# Patient Record
Sex: Female | Born: 1960 | Race: White | Hispanic: No | Marital: Married | Smoking: Never smoker
Health system: Southern US, Community
[De-identification: ages and names within clinical notes are randomized; demographics above are authoritative.]

## PROBLEM LIST (undated history)

## (undated) DIAGNOSIS — R569 Unspecified convulsions: Secondary | ICD-10-CM

## (undated) DIAGNOSIS — C801 Malignant (primary) neoplasm, unspecified: Secondary | ICD-10-CM

## (undated) DIAGNOSIS — C439 Malignant melanoma of skin, unspecified: Secondary | ICD-10-CM

## (undated) DIAGNOSIS — I639 Cerebral infarction, unspecified: Secondary | ICD-10-CM

## (undated) DIAGNOSIS — C8590 Non-Hodgkin lymphoma, unspecified, unspecified site: Secondary | ICD-10-CM

## (undated) HISTORY — PX: OCCIPITAL NERVE STIMULATOR INSERTION: SHX2095

## (undated) HISTORY — PX: BRAIN SURGERY: SHX531

## (undated) HISTORY — PX: ABDOMINAL HYSTERECTOMY: SHX81

---

## 1997-10-26 ENCOUNTER — Inpatient Hospital Stay (HOSPITAL_COMMUNITY): Admission: AD | Admit: 1997-10-26 | Discharge: 1997-10-26 | Payer: Self-pay | Admitting: Obstetrics and Gynecology

## 1997-12-12 ENCOUNTER — Inpatient Hospital Stay (HOSPITAL_COMMUNITY): Admission: AD | Admit: 1997-12-12 | Discharge: 1997-12-12 | Payer: Self-pay | Admitting: Obstetrics and Gynecology

## 1997-12-21 ENCOUNTER — Inpatient Hospital Stay (HOSPITAL_COMMUNITY): Admission: AD | Admit: 1997-12-21 | Discharge: 1997-12-23 | Payer: Self-pay | Admitting: Obstetrics and Gynecology

## 1998-01-16 ENCOUNTER — Inpatient Hospital Stay (HOSPITAL_COMMUNITY): Admission: AD | Admit: 1998-01-16 | Discharge: 1998-01-18 | Payer: Self-pay | Admitting: Obstetrics & Gynecology

## 1998-02-22 ENCOUNTER — Other Ambulatory Visit: Admission: RE | Admit: 1998-02-22 | Discharge: 1998-02-22 | Payer: Self-pay | Admitting: *Deleted

## 1998-04-05 ENCOUNTER — Ambulatory Visit (HOSPITAL_COMMUNITY): Admission: RE | Admit: 1998-04-05 | Discharge: 1998-04-05 | Payer: Self-pay | Admitting: *Deleted

## 1999-06-17 ENCOUNTER — Other Ambulatory Visit: Admission: RE | Admit: 1999-06-17 | Discharge: 1999-06-17 | Payer: Self-pay | Admitting: *Deleted

## 2000-06-09 ENCOUNTER — Other Ambulatory Visit: Admission: RE | Admit: 2000-06-09 | Discharge: 2000-06-09 | Payer: Self-pay | Admitting: *Deleted

## 2001-06-10 ENCOUNTER — Other Ambulatory Visit: Admission: RE | Admit: 2001-06-10 | Discharge: 2001-06-10 | Payer: Self-pay | Admitting: *Deleted

## 2001-10-15 ENCOUNTER — Ambulatory Visit (HOSPITAL_COMMUNITY): Admission: RE | Admit: 2001-10-15 | Discharge: 2001-10-15 | Payer: Self-pay | Admitting: *Deleted

## 2001-10-15 ENCOUNTER — Encounter (INDEPENDENT_AMBULATORY_CARE_PROVIDER_SITE_OTHER): Payer: Self-pay

## 2001-11-11 ENCOUNTER — Inpatient Hospital Stay (HOSPITAL_COMMUNITY): Admission: AD | Admit: 2001-11-11 | Discharge: 2001-11-11 | Payer: Self-pay | Admitting: Obstetrics & Gynecology

## 2002-07-12 ENCOUNTER — Encounter (INDEPENDENT_AMBULATORY_CARE_PROVIDER_SITE_OTHER): Payer: Self-pay

## 2002-07-13 ENCOUNTER — Inpatient Hospital Stay (HOSPITAL_COMMUNITY): Admission: RE | Admit: 2002-07-13 | Discharge: 2002-07-14 | Payer: Self-pay | Admitting: *Deleted

## 2002-09-05 ENCOUNTER — Emergency Department (HOSPITAL_COMMUNITY): Admission: EM | Admit: 2002-09-05 | Discharge: 2002-09-06 | Payer: Self-pay | Admitting: Emergency Medicine

## 2002-09-06 ENCOUNTER — Encounter: Payer: Self-pay | Admitting: Emergency Medicine

## 2003-10-11 ENCOUNTER — Other Ambulatory Visit: Admission: RE | Admit: 2003-10-11 | Discharge: 2003-10-11 | Payer: Self-pay | Admitting: *Deleted

## 2005-07-16 ENCOUNTER — Inpatient Hospital Stay (HOSPITAL_COMMUNITY): Admission: EM | Admit: 2005-07-16 | Discharge: 2005-07-17 | Payer: Self-pay | Admitting: Emergency Medicine

## 2005-07-16 ENCOUNTER — Encounter (INDEPENDENT_AMBULATORY_CARE_PROVIDER_SITE_OTHER): Payer: Self-pay | Admitting: Specialist

## 2005-08-06 ENCOUNTER — Other Ambulatory Visit: Admission: RE | Admit: 2005-08-06 | Discharge: 2005-08-06 | Payer: Self-pay | Admitting: Obstetrics and Gynecology

## 2006-06-09 ENCOUNTER — Encounter: Admission: RE | Admit: 2006-06-09 | Discharge: 2006-06-09 | Payer: Self-pay | Admitting: Family Medicine

## 2007-04-28 ENCOUNTER — Inpatient Hospital Stay (HOSPITAL_COMMUNITY): Admission: EM | Admit: 2007-04-28 | Discharge: 2007-04-30 | Payer: Self-pay | Admitting: Emergency Medicine

## 2007-06-02 ENCOUNTER — Emergency Department (HOSPITAL_COMMUNITY): Admission: EM | Admit: 2007-06-02 | Discharge: 2007-06-03 | Payer: Self-pay | Admitting: Emergency Medicine

## 2007-11-13 ENCOUNTER — Emergency Department (HOSPITAL_COMMUNITY): Admission: EM | Admit: 2007-11-13 | Discharge: 2007-11-13 | Payer: Self-pay | Admitting: Emergency Medicine

## 2007-12-31 ENCOUNTER — Emergency Department (HOSPITAL_COMMUNITY): Admission: EM | Admit: 2007-12-31 | Discharge: 2007-12-31 | Payer: Self-pay | Admitting: Emergency Medicine

## 2008-04-06 ENCOUNTER — Encounter: Payer: Self-pay | Admitting: Emergency Medicine

## 2008-04-06 ENCOUNTER — Inpatient Hospital Stay (HOSPITAL_COMMUNITY): Admission: RE | Admit: 2008-04-06 | Discharge: 2008-04-08 | Payer: Self-pay | Admitting: Internal Medicine

## 2008-04-07 ENCOUNTER — Ambulatory Visit: Payer: Self-pay | Admitting: Cardiology

## 2008-04-08 ENCOUNTER — Encounter: Payer: Self-pay | Admitting: Internal Medicine

## 2008-04-18 IMAGING — CR DG CHEST 2V
2 series · 2 of 2 positions shown · non-contrast
Comparison: 06/02/2007.

CLINICAL DATA: Shortness of breath, chest pain, tachycardia, cough

CHEST - 2 VIEW

[w chest lat]
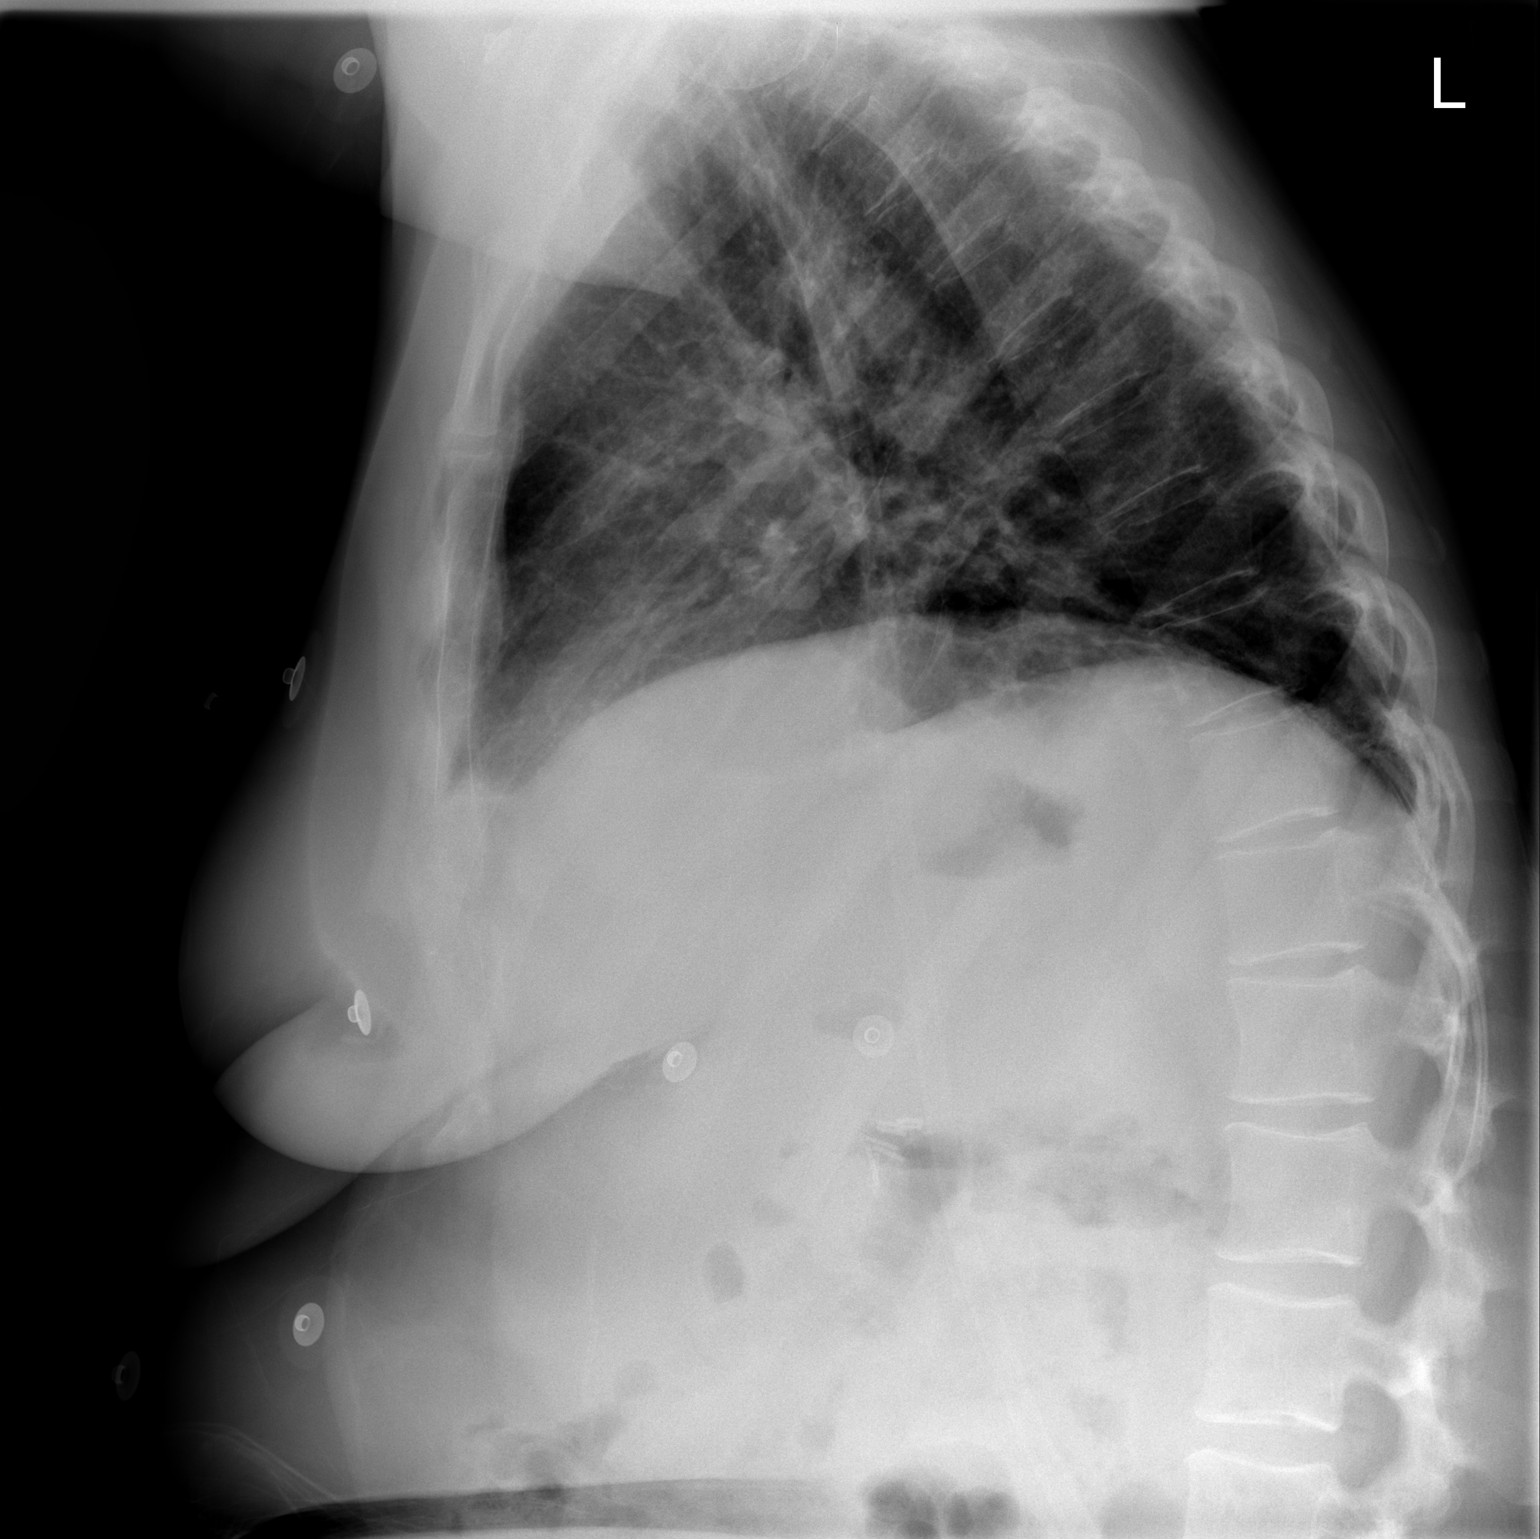

[w chest pa]
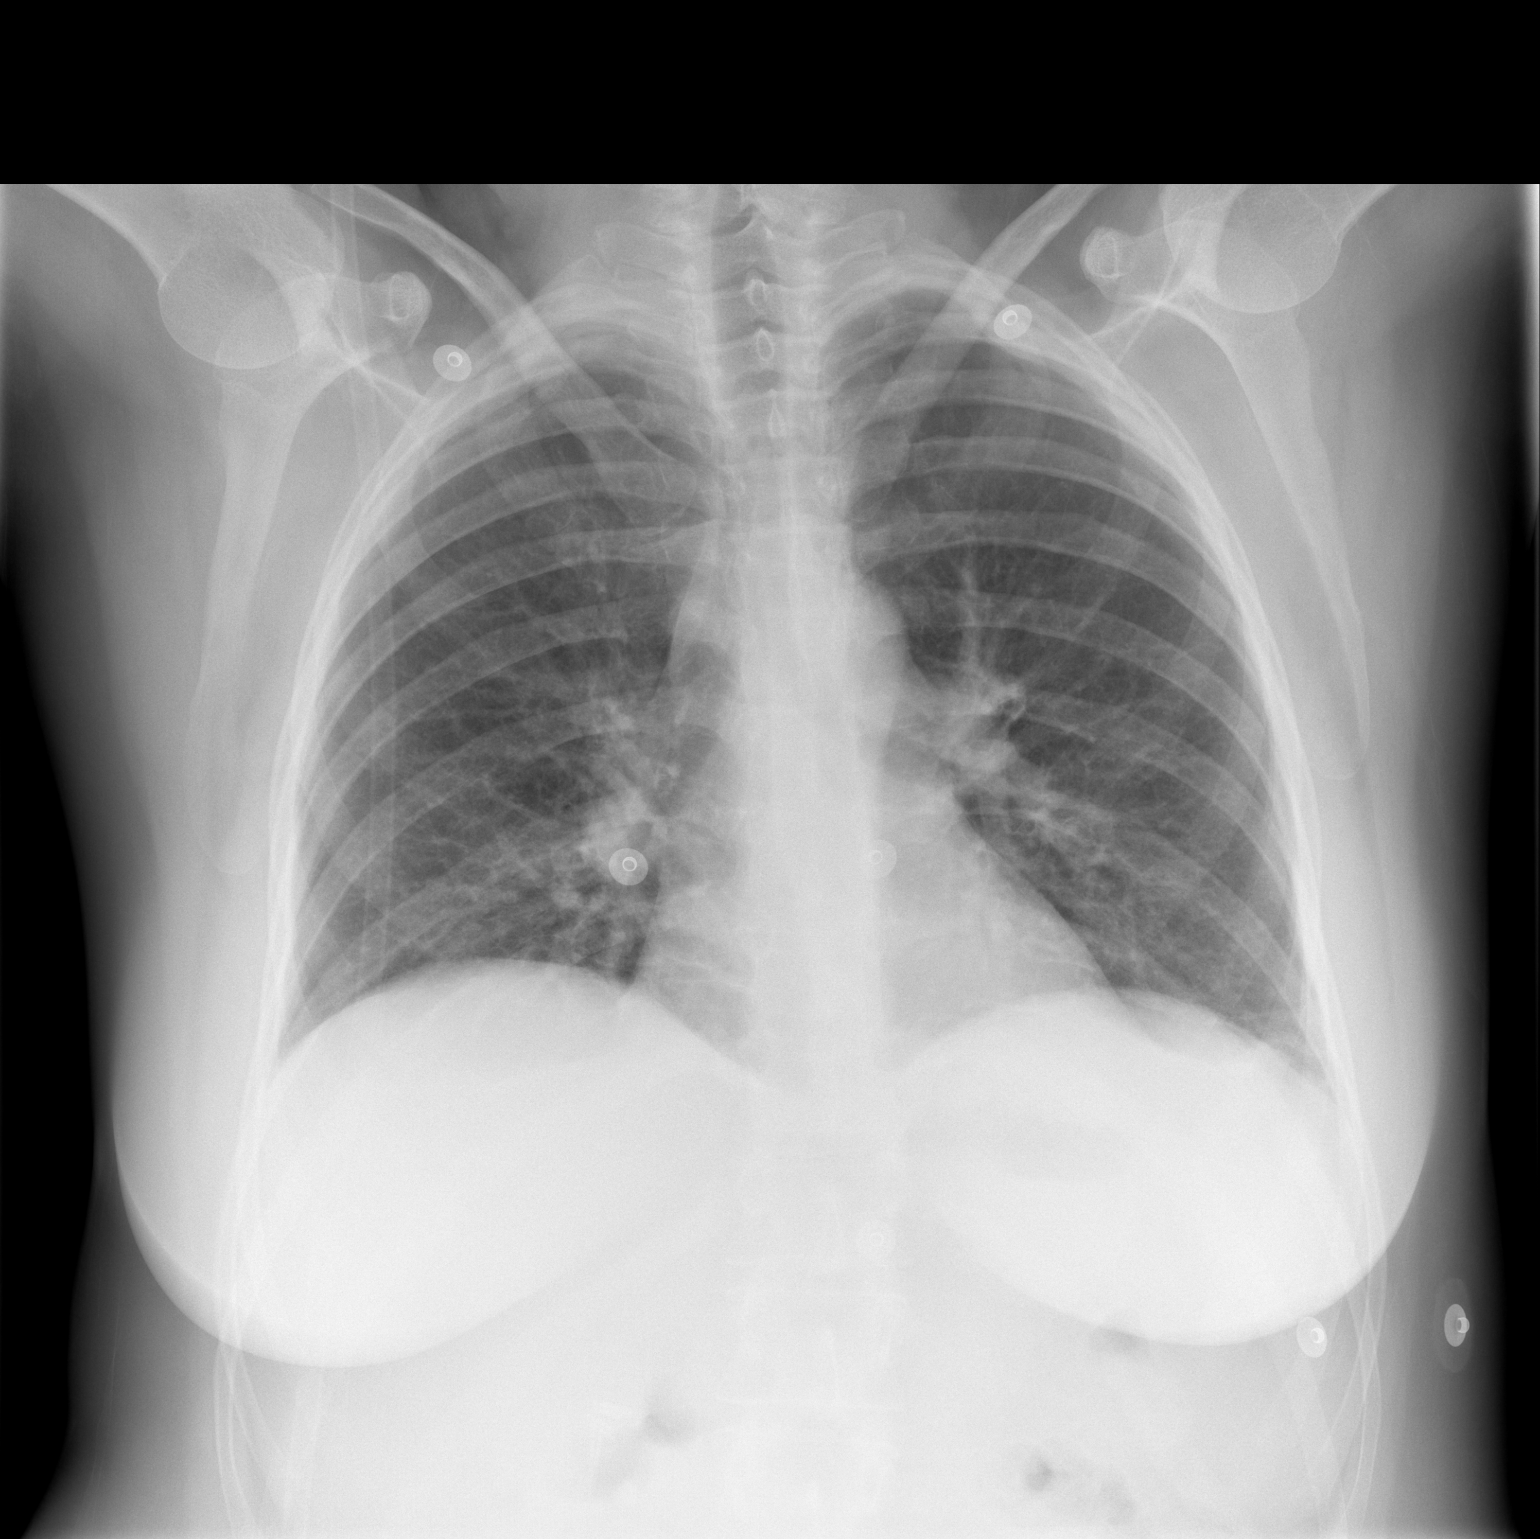

[2 of 2 positions shown; findings below may reference images not displayed]

FINDINGS: There are slightly prominent markings at the lung bases
most consistent with atelectasis.  No focal pneumonia is seen.
Mild peribronchial thickening is noted.  No effusion is seen.
Heart is within normal limits in size.
IMPRESSION: Probable mild bibasilar linear atelectasis.  No infiltrate or
effusion is seen.

## 2008-05-20 ENCOUNTER — Emergency Department (HOSPITAL_BASED_OUTPATIENT_CLINIC_OR_DEPARTMENT_OTHER): Admission: EM | Admit: 2008-05-20 | Discharge: 2008-05-20 | Payer: Self-pay | Admitting: Emergency Medicine

## 2008-08-03 ENCOUNTER — Emergency Department (HOSPITAL_BASED_OUTPATIENT_CLINIC_OR_DEPARTMENT_OTHER): Admission: EM | Admit: 2008-08-03 | Discharge: 2008-08-03 | Payer: Self-pay | Admitting: Emergency Medicine

## 2008-08-27 ENCOUNTER — Emergency Department (HOSPITAL_COMMUNITY): Admission: EM | Admit: 2008-08-27 | Discharge: 2008-08-27 | Payer: Self-pay | Admitting: Emergency Medicine

## 2008-09-07 ENCOUNTER — Ambulatory Visit: Payer: Self-pay | Admitting: Interventional Radiology

## 2008-09-07 ENCOUNTER — Emergency Department (HOSPITAL_BASED_OUTPATIENT_CLINIC_OR_DEPARTMENT_OTHER): Admission: EM | Admit: 2008-09-07 | Discharge: 2008-09-07 | Payer: Self-pay | Admitting: Emergency Medicine

## 2008-09-08 ENCOUNTER — Emergency Department (HOSPITAL_BASED_OUTPATIENT_CLINIC_OR_DEPARTMENT_OTHER): Admission: EM | Admit: 2008-09-08 | Discharge: 2008-09-08 | Payer: Self-pay | Admitting: Emergency Medicine

## 2008-09-26 ENCOUNTER — Ambulatory Visit: Payer: Self-pay | Admitting: Diagnostic Radiology

## 2008-09-26 ENCOUNTER — Emergency Department (HOSPITAL_BASED_OUTPATIENT_CLINIC_OR_DEPARTMENT_OTHER): Admission: EM | Admit: 2008-09-26 | Discharge: 2008-09-26 | Payer: Self-pay | Admitting: Emergency Medicine

## 2008-10-09 ENCOUNTER — Ambulatory Visit: Payer: Self-pay | Admitting: Interventional Radiology

## 2008-10-09 ENCOUNTER — Emergency Department (HOSPITAL_BASED_OUTPATIENT_CLINIC_OR_DEPARTMENT_OTHER): Admission: EM | Admit: 2008-10-09 | Discharge: 2008-10-09 | Payer: Self-pay | Admitting: Internal Medicine

## 2008-11-19 ENCOUNTER — Emergency Department (HOSPITAL_BASED_OUTPATIENT_CLINIC_OR_DEPARTMENT_OTHER): Admission: EM | Admit: 2008-11-19 | Discharge: 2008-11-19 | Payer: Self-pay | Admitting: Internal Medicine

## 2008-12-10 ENCOUNTER — Emergency Department (HOSPITAL_BASED_OUTPATIENT_CLINIC_OR_DEPARTMENT_OTHER): Admission: EM | Admit: 2008-12-10 | Discharge: 2008-12-10 | Payer: Self-pay | Admitting: Emergency Medicine

## 2008-12-18 ENCOUNTER — Emergency Department (HOSPITAL_BASED_OUTPATIENT_CLINIC_OR_DEPARTMENT_OTHER): Admission: EM | Admit: 2008-12-18 | Discharge: 2008-12-18 | Payer: Self-pay | Admitting: Emergency Medicine

## 2008-12-18 ENCOUNTER — Ambulatory Visit: Payer: Self-pay | Admitting: Radiology

## 2009-01-02 ENCOUNTER — Inpatient Hospital Stay (HOSPITAL_COMMUNITY): Admission: EM | Admit: 2009-01-02 | Discharge: 2009-01-04 | Payer: Self-pay | Admitting: Emergency Medicine

## 2009-01-04 ENCOUNTER — Inpatient Hospital Stay (HOSPITAL_COMMUNITY): Admission: RE | Admit: 2009-01-04 | Discharge: 2009-01-06 | Payer: Self-pay | Admitting: *Deleted

## 2009-01-04 ENCOUNTER — Ambulatory Visit: Payer: Self-pay | Admitting: *Deleted

## 2009-02-27 ENCOUNTER — Observation Stay (HOSPITAL_COMMUNITY): Admission: EM | Admit: 2009-02-27 | Discharge: 2009-02-28 | Payer: Self-pay | Admitting: Emergency Medicine

## 2009-02-27 ENCOUNTER — Ambulatory Visit: Payer: Self-pay | Admitting: *Deleted

## 2009-02-27 ENCOUNTER — Ambulatory Visit: Payer: Self-pay | Admitting: Internal Medicine

## 2009-02-28 ENCOUNTER — Encounter: Payer: Self-pay | Admitting: Internal Medicine

## 2009-04-06 IMAGING — CT CT HEAD W/O CM
1 series · 16 of 30 positions shown, 20 images · non-contrast
Comparison: None

CLINICAL DATA: Headache

CT HEAD WITHOUT CONTRAST
TECHNIQUE: Contiguous axial images were obtained from the base of
the skull through the vertex without contrast.

[Series 2: head 4.8 h37s · axial · 0.44mm/px · z∈[+1258,+1395]mm · 16 of 32 slices shown, 20 images]
[im 2/32  brain]
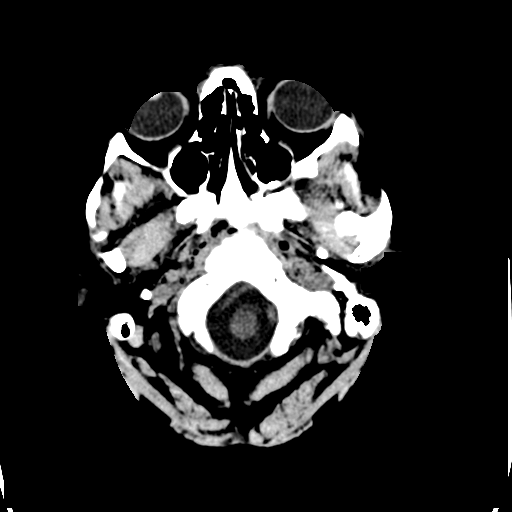
[im 2/32  bone]
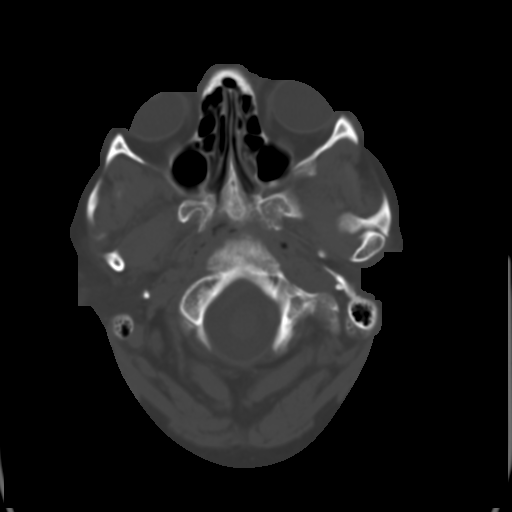
[im 4/32  brain]
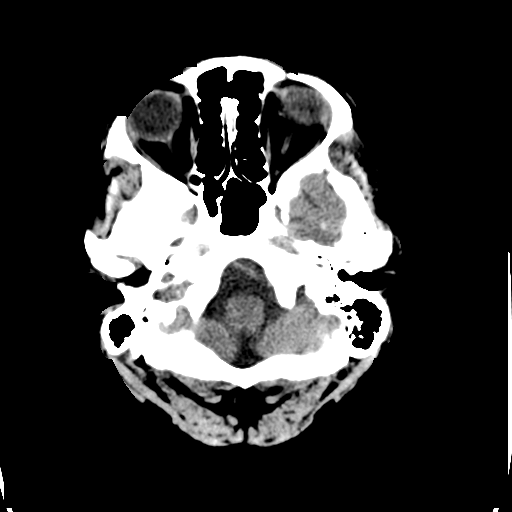
[im 6/32  brain]
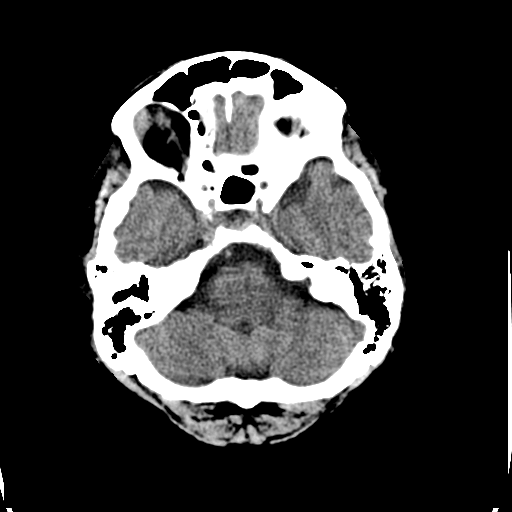
[im 8/32  brain]
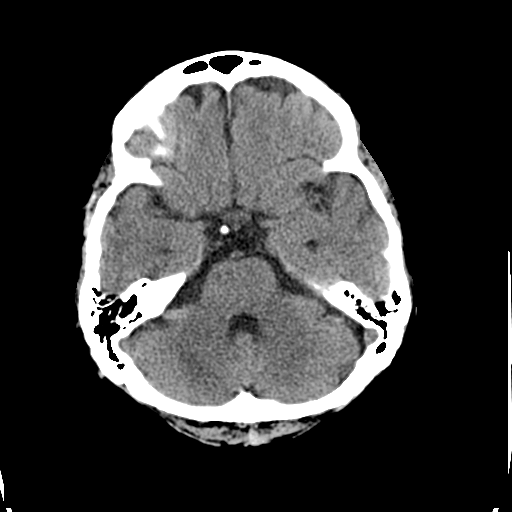
[im 9/32  brain]
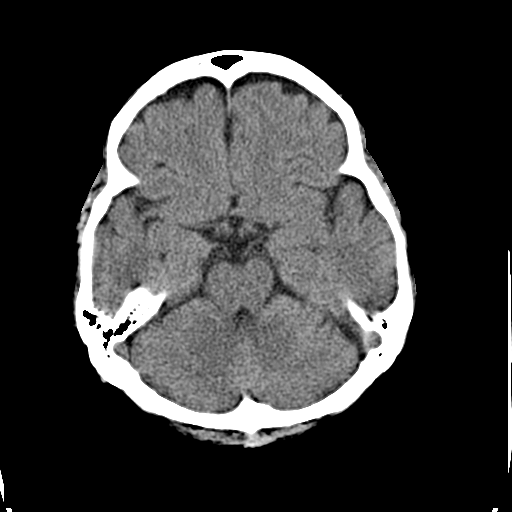
[im 9/32  bone]
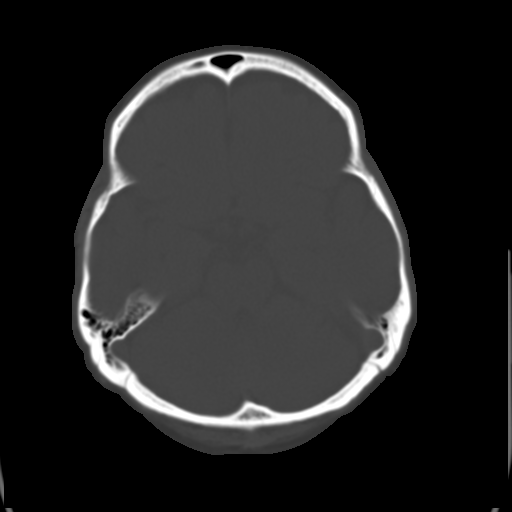
[im 11/32  brain]
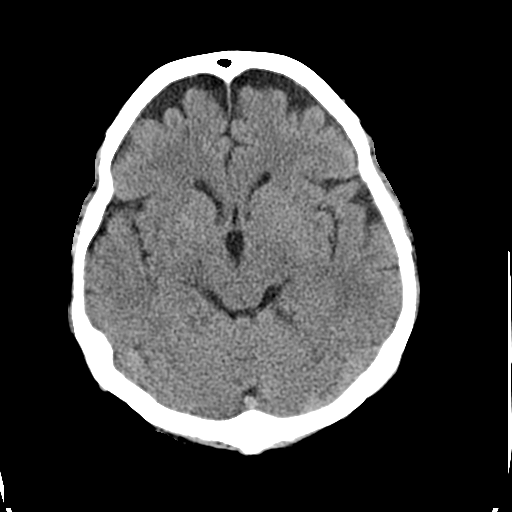
[im 13/32  brain]
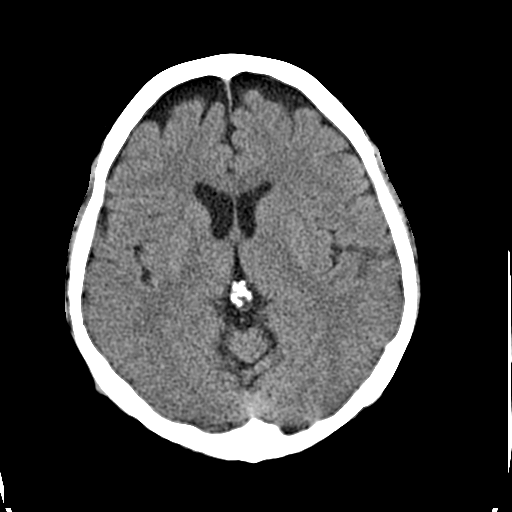
[im 15/32  brain]
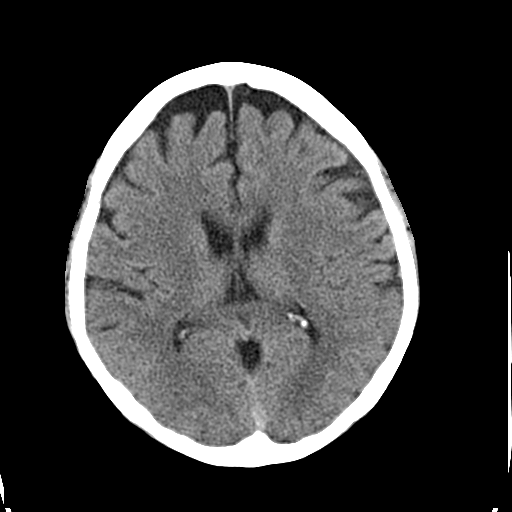
[im 17/32  brain]
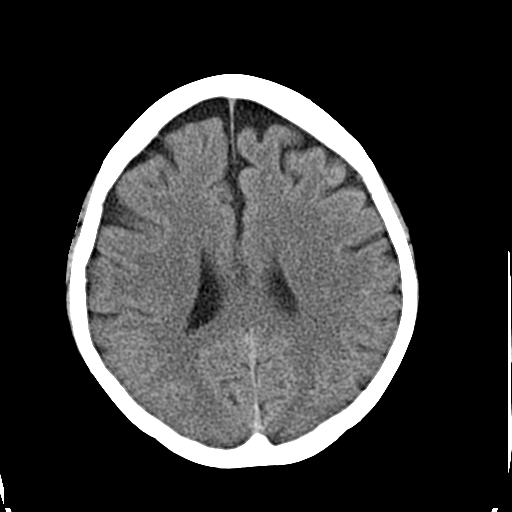
[im 17/32  bone]
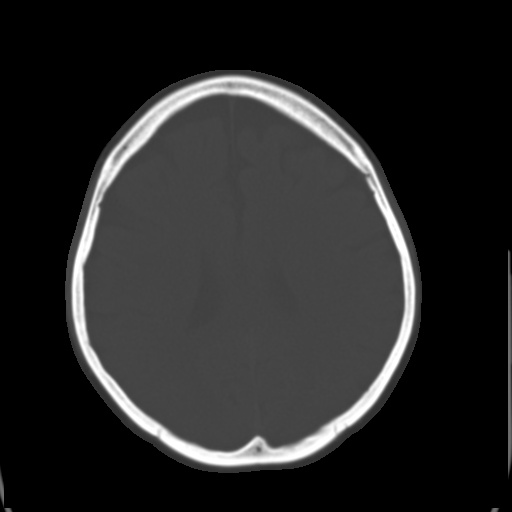
[im 19/32  brain]
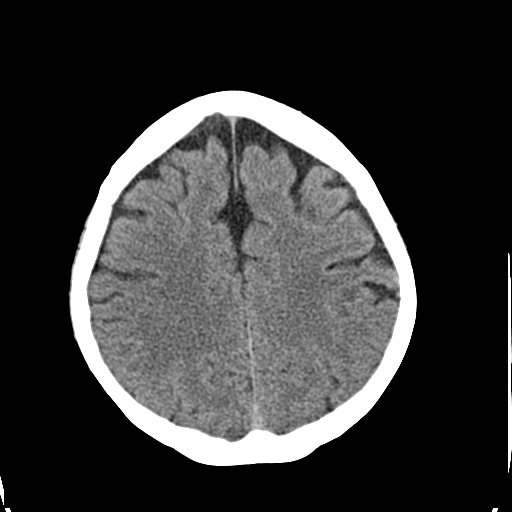
[im 21/32  brain]
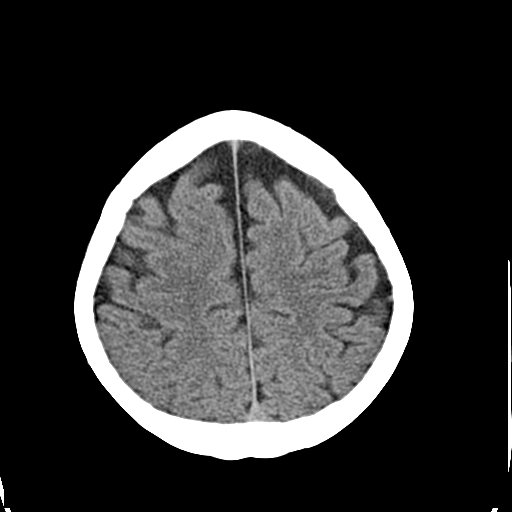
[im 23/32  brain]
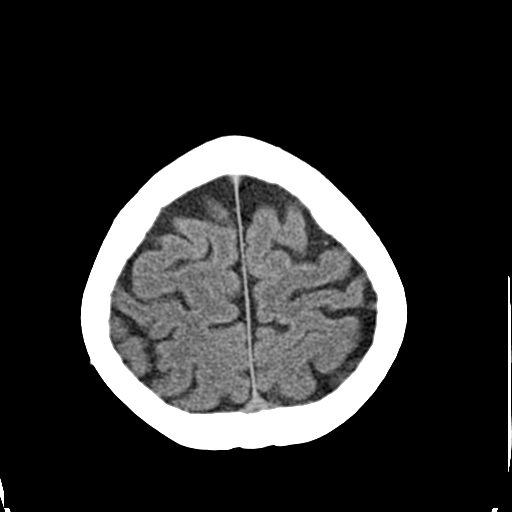
[im 24/32  brain]
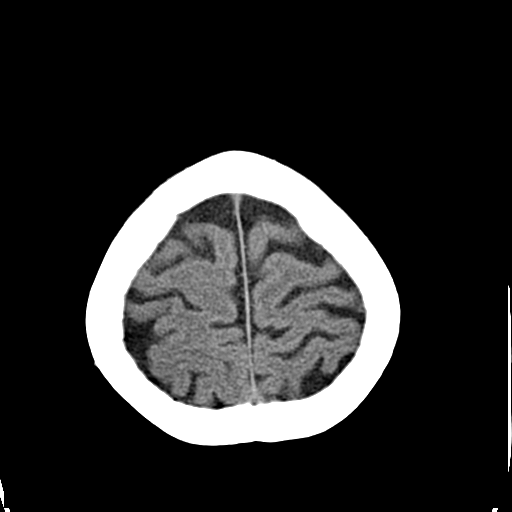
[im 24/32  bone]
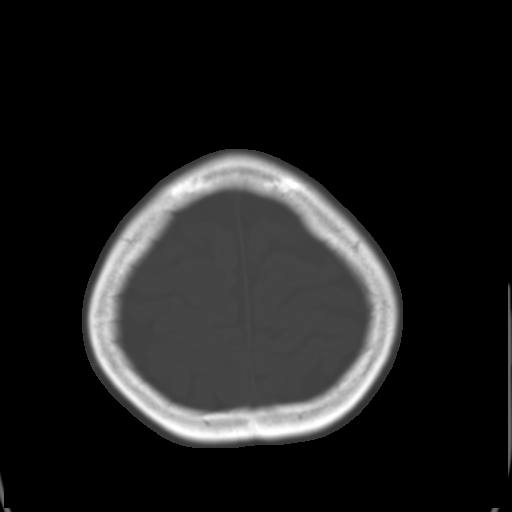
[im 26/32  brain]
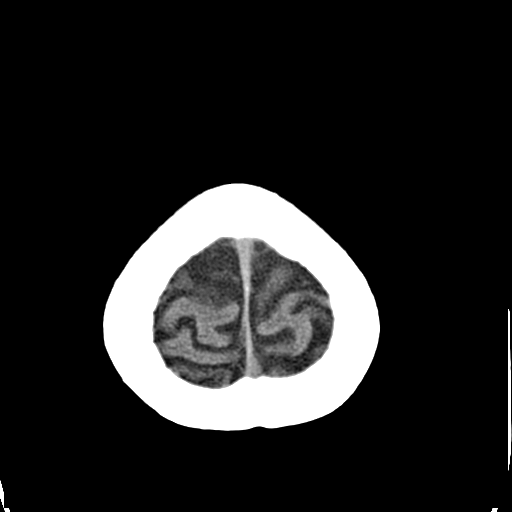
[im 28/32  brain]
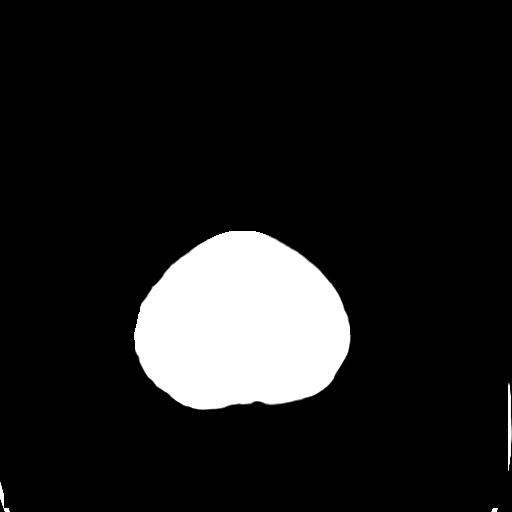
[im 30/32  brain]
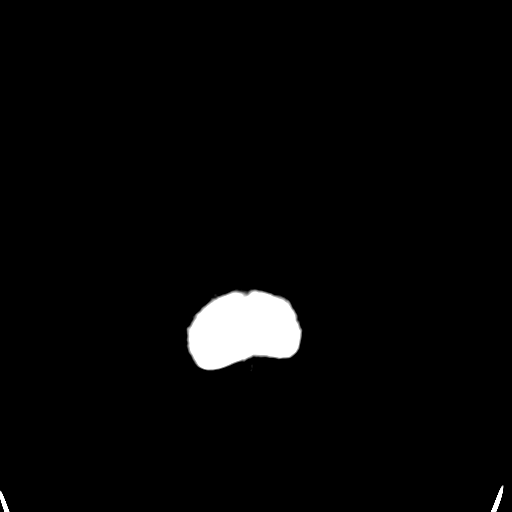

[16 of 30 positions shown; findings below may reference images not displayed]

FINDINGS: No acute intracranial hemorrhage or edema.  Bilateral
frontal lobe atrophy.  Ventricles and basilar cisterns are midline.
No extraaxial fluid collections.
IMPRESSION: Negative for acute intracranial hemorrhage, acute infarction or
mass lesions.  Bilateral frontal atrophy is noted.

## 2009-07-04 ENCOUNTER — Ambulatory Visit: Payer: Self-pay | Admitting: Diagnostic Radiology

## 2009-07-04 ENCOUNTER — Emergency Department (HOSPITAL_BASED_OUTPATIENT_CLINIC_OR_DEPARTMENT_OTHER): Admission: EM | Admit: 2009-07-04 | Discharge: 2009-07-04 | Payer: Self-pay | Admitting: Emergency Medicine

## 2009-08-11 ENCOUNTER — Emergency Department (HOSPITAL_BASED_OUTPATIENT_CLINIC_OR_DEPARTMENT_OTHER): Admission: EM | Admit: 2009-08-11 | Discharge: 2009-08-11 | Payer: Self-pay | Admitting: Emergency Medicine

## 2009-08-11 ENCOUNTER — Ambulatory Visit: Payer: Self-pay | Admitting: Diagnostic Radiology

## 2009-08-12 ENCOUNTER — Emergency Department (HOSPITAL_BASED_OUTPATIENT_CLINIC_OR_DEPARTMENT_OTHER): Admission: EM | Admit: 2009-08-12 | Discharge: 2009-08-12 | Payer: Self-pay | Admitting: Emergency Medicine

## 2009-11-26 ENCOUNTER — Emergency Department (HOSPITAL_BASED_OUTPATIENT_CLINIC_OR_DEPARTMENT_OTHER): Admission: EM | Admit: 2009-11-26 | Discharge: 2009-11-27 | Payer: Self-pay | Admitting: Emergency Medicine

## 2010-06-06 ENCOUNTER — Emergency Department (HOSPITAL_BASED_OUTPATIENT_CLINIC_OR_DEPARTMENT_OTHER)
Admission: EM | Admit: 2010-06-06 | Discharge: 2010-06-06 | Payer: Self-pay | Source: Home / Self Care | Admitting: Emergency Medicine

## 2010-12-03 ENCOUNTER — Emergency Department (HOSPITAL_BASED_OUTPATIENT_CLINIC_OR_DEPARTMENT_OTHER)
Admission: EM | Admit: 2010-12-03 | Discharge: 2010-12-04 | Disposition: A | Discharge status: Home / Self Care | Payer: Federal, State, Local not specified - PPO | Attending: Emergency Medicine | Admitting: Emergency Medicine

## 2010-12-03 DIAGNOSIS — R110 Nausea: Secondary | ICD-10-CM | POA: Insufficient documentation

## 2010-12-03 DIAGNOSIS — H53149 Visual discomfort, unspecified: Secondary | ICD-10-CM | POA: Insufficient documentation

## 2010-12-03 DIAGNOSIS — Z79899 Other long term (current) drug therapy: Secondary | ICD-10-CM | POA: Insufficient documentation

## 2010-12-03 DIAGNOSIS — R51 Headache: Secondary | ICD-10-CM | POA: Insufficient documentation

## 2010-12-03 DIAGNOSIS — I10 Essential (primary) hypertension: Secondary | ICD-10-CM | POA: Insufficient documentation

## 2010-12-26 LAB — DIFFERENTIAL
Basophils Absolute: 0 10*3/uL (ref 0.0–0.1)
Basophils Relative: 1 % (ref 0–1)
Eosinophils Absolute: 0.1 10*3/uL (ref 0.0–0.7)
Monocytes Absolute: 0.3 10*3/uL (ref 0.1–1.0)
Neutro Abs: 2.8 10*3/uL (ref 1.7–7.7)
Neutrophils Relative %: 60 % (ref 43–77)

## 2010-12-26 LAB — BASIC METABOLIC PANEL
CO2: 30 mEq/L (ref 19–32)
Calcium: 9.3 mg/dL (ref 8.4–10.5)
Creatinine, Ser: 0.7 mg/dL (ref 0.4–1.2)
Glucose, Bld: 196 mg/dL — ABNORMAL HIGH (ref 70–99)

## 2010-12-26 LAB — CBC
Hemoglobin: 13.2 g/dL (ref 12.0–15.0)
MCHC: 35.3 g/dL (ref 30.0–36.0)
RDW: 11.9 % (ref 11.5–15.5)

## 2010-12-26 LAB — POCT CARDIAC MARKERS: CKMB, poc: <1 ng/mL — ABNORMAL LOW (ref 1.0–8.0)

## 2010-12-30 LAB — BASIC METABOLIC PANEL
BUN: 11 mg/dL (ref 6–23)
CO2: 27 mEq/L (ref 19–32)
GFR calc non Af Amer: >60 mL/min (ref >60)
Glucose, Bld: 148 mg/dL — ABNORMAL HIGH (ref 70–99)
Potassium: 4.5 mEq/L (ref 3.5–5.1)

## 2010-12-30 LAB — LIPID PANEL
Cholesterol: 177 mg/dL (ref 0–200)
HDL: 32 mg/dL — ABNORMAL LOW (ref >39)
Total CHOL/HDL Ratio: 5.5 RATIO
Triglycerides: 155 mg/dL — ABNORMAL HIGH (ref <150)

## 2010-12-30 LAB — COMPREHENSIVE METABOLIC PANEL
ALT: 80 U/L — ABNORMAL HIGH (ref 0–35)
CO2: 24 mEq/L (ref 19–32)
Calcium: 9.1 mg/dL (ref 8.4–10.5)
Creatinine, Ser: 0.81 mg/dL (ref 0.4–1.2)
GFR calc Af Amer: >60 mL/min (ref >60)
GFR calc non Af Amer: >60 mL/min (ref >60)
Glucose, Bld: 127 mg/dL — ABNORMAL HIGH (ref 70–99)
Sodium: 142 mEq/L (ref 135–145)
Total Protein: 6.3 g/dL (ref 6.0–8.3)

## 2010-12-30 LAB — URINE DRUGS OF ABUSE SCREEN W ALC, ROUTINE (REF LAB)
Barbiturate Quant, Ur: NEGATIVE
Creatinine,U: 141.9 mg/dL
Ethyl Alcohol: <10 mg/dL (ref <10)
Marijuana Metabolite: NEGATIVE
Opiate Screen, Urine: NEGATIVE

## 2010-12-30 LAB — CBC
HCT: 40.7 % (ref 36.0–46.0)
Hemoglobin: 13.4 g/dL (ref 12.0–15.0)
MCHC: 34.4 g/dL (ref 30.0–36.0)
MCHC: 35.7 g/dL (ref 30.0–36.0)
MCV: 87.3 fL (ref 78.0–100.0)
MCV: 87.4 fL (ref 78.0–100.0)
Platelets: 187 10*3/uL (ref 150–400)
RBC: 4.3 MIL/uL (ref 3.87–5.11)
RDW: 13 % (ref 11.5–15.5)
RDW: 13 % (ref 11.5–15.5)

## 2010-12-30 LAB — GLUCOSE, CAPILLARY
Glucose-Capillary: 100 mg/dL — ABNORMAL HIGH (ref 70–99)
Glucose-Capillary: 125 mg/dL — ABNORMAL HIGH (ref 70–99)
Glucose-Capillary: 126 mg/dL — ABNORMAL HIGH (ref 70–99)

## 2010-12-30 LAB — CARDIAC PANEL(CRET KIN+CKTOT+MB+TROPI)
CK, MB: 0.8 ng/mL (ref 0.3–4.0)
Relative Index: INVALID (ref 0.0–2.5)
Total CK: 37 U/L (ref 7–177)
Troponin I: 0.01 ng/mL (ref 0.00–0.06)

## 2010-12-30 LAB — CK TOTAL AND CKMB (NOT AT ARMC)
CK, MB: 0.6 ng/mL (ref 0.3–4.0)
Relative Index: INVALID (ref 0.0–2.5)
Total CK: 45 U/L (ref 7–177)

## 2010-12-30 LAB — TSH: TSH: 1.112 u[IU]/mL (ref 0.350–4.500)

## 2010-12-30 LAB — RAPID URINE DRUG SCREEN, HOSP PERFORMED
Amphetamines: NOT DETECTED
Tetrahydrocannabinol: NOT DETECTED

## 2010-12-30 LAB — TROPONIN I: Troponin I: 0.01 ng/mL (ref 0.00–0.06)

## 2010-12-30 LAB — POCT CARDIAC MARKERS: Myoglobin, poc: 44.9 ng/mL (ref 12–200)

## 2011-01-01 LAB — POCT I-STAT, CHEM 8
Calcium, Ion: 1.15 mmol/L (ref 1.12–1.32)
Creatinine, Ser: 0.8 mg/dL (ref 0.4–1.2)
Glucose, Bld: 178 mg/dL — ABNORMAL HIGH (ref 70–99)
Hemoglobin: 12.6 g/dL (ref 12.0–15.0)
Potassium: 3.7 mEq/L (ref 3.5–5.1)

## 2011-01-01 LAB — GLUCOSE, CAPILLARY
Glucose-Capillary: 140 mg/dL — ABNORMAL HIGH (ref 70–99)
Glucose-Capillary: 156 mg/dL — ABNORMAL HIGH (ref 70–99)
Glucose-Capillary: 120 mg/dL — ABNORMAL HIGH (ref 70–99)
Glucose-Capillary: 123 mg/dL — ABNORMAL HIGH (ref 70–99)
Glucose-Capillary: 131 mg/dL — ABNORMAL HIGH (ref 70–99)
Glucose-Capillary: 143 mg/dL — ABNORMAL HIGH (ref 70–99)
Glucose-Capillary: 145 mg/dL — ABNORMAL HIGH (ref 70–99)
Glucose-Capillary: 147 mg/dL — ABNORMAL HIGH (ref 70–99)
Glucose-Capillary: 162 mg/dL — ABNORMAL HIGH (ref 70–99)
Glucose-Capillary: 220 mg/dL — ABNORMAL HIGH (ref 70–99)

## 2011-01-01 LAB — URINALYSIS, ROUTINE W REFLEX MICROSCOPIC
Glucose, UA: 250 mg/dL — AB
Ketones, ur: NEGATIVE mg/dL
Nitrite: NEGATIVE
Protein, ur: NEGATIVE mg/dL

## 2011-01-01 LAB — BLOOD GAS, ARTERIAL
Acid-base deficit: 0.3 mmol/L (ref 0.0–2.0)
Bicarbonate: 22.9 mEq/L (ref 20.0–24.0)
Patient temperature: 98.6
TCO2: 20.6 mmol/L (ref 0–100)
pCO2 arterial: 34.4 mmHg — ABNORMAL LOW (ref 35.0–45.0)
pH, Arterial: 7.439 — ABNORMAL HIGH (ref 7.350–7.400)

## 2011-01-01 LAB — CK TOTAL AND CKMB (NOT AT ARMC)
CK, MB: 0.5 ng/mL (ref 0.3–4.0)
Relative Index: INVALID (ref 0.0–2.5)
Total CK: 31 U/L (ref 7–177)

## 2011-01-01 LAB — DIFFERENTIAL
Basophils Relative: 0 % (ref 0–1)
Lymphocytes Relative: 33 % (ref 12–46)
Lymphs Abs: 1.1 10*3/uL (ref 0.7–4.0)
Monocytes Absolute: 0.3 10*3/uL (ref 0.1–1.0)
Monocytes Relative: 9 % (ref 3–12)
Neutro Abs: 2 10*3/uL (ref 1.7–7.7)
Neutrophils Relative %: 57 % (ref 43–77)

## 2011-01-01 LAB — RAPID URINE DRUG SCREEN, HOSP PERFORMED
Cocaine: NOT DETECTED
Tetrahydrocannabinol: NOT DETECTED

## 2011-01-01 LAB — COMPREHENSIVE METABOLIC PANEL
ALT: 62 U/L — ABNORMAL HIGH (ref 0–35)
Calcium: 8.3 mg/dL — ABNORMAL LOW (ref 8.4–10.5)
GFR calc Af Amer: >60 mL/min (ref >60)
Glucose, Bld: 130 mg/dL — ABNORMAL HIGH (ref 70–99)
Sodium: 140 mEq/L (ref 135–145)
Total Protein: 5.3 g/dL — ABNORMAL LOW (ref 6.0–8.3)

## 2011-01-01 LAB — CBC
Hemoglobin: 11.7 g/dL — ABNORMAL LOW (ref 12.0–15.0)
Hemoglobin: 12.7 g/dL (ref 12.0–15.0)
MCHC: 34.3 g/dL (ref 30.0–36.0)
RBC: 4.21 MIL/uL (ref 3.87–5.11)
RDW: 13.2 % (ref 11.5–15.5)
WBC: 3.5 10*3/uL — ABNORMAL LOW (ref 4.0–10.5)

## 2011-01-01 LAB — HEMOGLOBIN A1C: Hgb A1c MFr Bld: 7 % — ABNORMAL HIGH (ref 4.6–6.1)

## 2011-01-01 LAB — HEPATIC FUNCTION PANEL
Albumin: 3.4 g/dL — ABNORMAL LOW (ref 3.5–5.2)
Indirect Bilirubin: 0.5 mg/dL (ref 0.3–0.9)
Total Protein: 5.7 g/dL — ABNORMAL LOW (ref 6.0–8.3)

## 2011-01-01 LAB — ACETAMINOPHEN LEVEL: Acetaminophen (Tylenol), Serum: <10 ug/mL — ABNORMAL LOW (ref 10–30)

## 2011-01-01 LAB — ETHANOL: Alcohol, Ethyl (B): <5 mg/dL (ref 0–10)

## 2011-01-01 LAB — TSH: TSH: 0.651 u[IU]/mL (ref 0.350–4.500)

## 2011-01-01 LAB — POCT PREGNANCY, URINE: Preg Test, Ur: NEGATIVE

## 2011-01-06 LAB — URINALYSIS, ROUTINE W REFLEX MICROSCOPIC
Bilirubin Urine: NEGATIVE
Glucose, UA: 100 mg/dL — AB
Glucose, UA: 100 mg/dL — AB
Hgb urine dipstick: NEGATIVE
Hgb urine dipstick: NEGATIVE
Protein, ur: NEGATIVE mg/dL
Protein, ur: NEGATIVE mg/dL
Specific Gravity, Urine: 1.007 (ref 1.005–1.030)
Specific Gravity, Urine: 1.024 (ref 1.005–1.030)
Urobilinogen, UA: 1 mg/dL (ref 0.0–1.0)
pH: 5 (ref 5.0–8.0)

## 2011-01-06 LAB — URINE CULTURE: Culture: NO GROWTH

## 2011-01-06 LAB — DIFFERENTIAL
Basophils Absolute: 0 10*3/uL (ref 0.0–0.1)
Basophils Relative: 0 % (ref 0–1)
Eosinophils Absolute: 0 10*3/uL (ref 0.0–0.7)
Eosinophils Relative: 3 % (ref 0–5)
Lymphocytes Relative: 3 % — ABNORMAL LOW (ref 12–46)
Lymphs Abs: 0.2 10*3/uL — ABNORMAL LOW (ref 0.7–4.0)
Monocytes Absolute: 0.4 10*3/uL (ref 0.1–1.0)
Monocytes Relative: 10 % (ref 3–12)
Monocytes Relative: 4 % (ref 3–12)
Neutro Abs: 6.5 10*3/uL (ref 1.7–7.7)
Neutrophils Relative %: 92 % — ABNORMAL HIGH (ref 43–77)

## 2011-01-06 LAB — COMPREHENSIVE METABOLIC PANEL
AST: 82 U/L — ABNORMAL HIGH (ref 0–37)
Albumin: 5 g/dL (ref 3.5–5.2)
Alkaline Phosphatase: 89 U/L (ref 39–117)
Chloride: 100 mEq/L (ref 96–112)
GFR calc Af Amer: >60 mL/min (ref >60)
Potassium: 3.9 mEq/L (ref 3.5–5.1)
Sodium: 138 mEq/L (ref 135–145)
Total Bilirubin: 0.7 mg/dL (ref 0.3–1.2)
Total Protein: 7.6 g/dL (ref 6.0–8.3)

## 2011-01-06 LAB — BASIC METABOLIC PANEL
BUN: 16 mg/dL (ref 6–23)
Chloride: 104 mEq/L (ref 96–112)
Creatinine, Ser: 0.8 mg/dL (ref 0.4–1.2)
GFR calc Af Amer: >60 mL/min (ref >60)
GFR calc non Af Amer: >60 mL/min (ref >60)

## 2011-01-06 LAB — CBC
MCV: 87.7 fL (ref 78.0–100.0)
Platelets: 183 10*3/uL (ref 150–400)
Platelets: 158 10*3/uL (ref 150–400)
RBC: 5.14 MIL/uL — ABNORMAL HIGH (ref 3.87–5.11)
WBC: 3.8 10*3/uL — ABNORMAL LOW (ref 4.0–10.5)
WBC: 7.1 10*3/uL (ref 4.0–10.5)

## 2011-02-04 NOTE — H&P (Signed)
NAMESHAGUANA, LOVE                ACCOUNT NO.:  [id number]   MEDICAL RECORD NO.:  [id number]          PATIENT TYPE:  INP   LOCATION:  1411                         FACILITY:  [facility]   PHYSICIAN:  Thomasenia Bottoms, MDDATE OF BIRTH:  [birth year]   DATE OF ADMISSION:  04/06/2008  DATE OF DISCHARGE:                              HISTORY & PHYSICAL   CHIEF COMPLAINT:  Chest pain.   HISTORY OF PRESENT ILLNESS:  Mrs. Winslow is a 50 year old who presents  with sudden onset of crushing chest pain which radiated into her left  neck and left shoulder.  It started about 6 to 6:30 p.m.  It was  associated with severe shortness of breath and nausea.  The pain was so  severe she had her husband bring her right to the emergency department.  The pain was 10/10 when she presented to the emergency department.  It  did not improve with nitroglycerin but did improve with IV pain  medication.  Also of note, the patient did take a Tums at home.  Initially she thought it was heartburn but that did not help the pain.  She says she has never had pain like this before.  When I saw the  patient she rated her pain as a 1/10.  She also tells me that she had  some trouble with some calf pain over the last week or two but that  resolved today, actually.   PAST MEDICAL HISTORY:  1. Significant for non-Hodgkin's lymphoma.  She completed chemotherapy      3 years ago and has been cancer-free.  2. She also has a history of severe migraine headaches and is followed      at [facility] for this.  Six days ago she had some sort of injection in      her neck to block the nerve to dull the pain.  She says she has had      this procedure before.  This was not the first time.  3. She also has type 2 diabetes mellitus.  4. She is menopausal and has been having severe hot flashes for the      last 2 years.  5. She has a history of PTSD depression, anxiety.  6. History of cholecystectomy, which was done here in 2006.   MEDICATIONS ON ARRIVAL:  We do not have a medication list at this time.  She says she does take p.o. Dilaudid 1 mg for headaches as needed, and  we will have to wait for the rest of the medication list.  When she was  here in 2006 she was on metformin for her diabetes as well.   SOCIAL HISTORY:  She does not smoke cigarettes, drink alcohol or use any  illicit drugs.   FAMILY HISTORY:  The patient says she cannot think of anyone in the  family who has had a heart attack before.   REVIEW OF SYSTEMS:  CONSTITUTIONAL:  She is not losing weight.  If  anything, she is gaining weight.  Her appetite is excellent.  She is  having hot flashes all the  time but not fevers that she is aware of.  She feels hot and sleeps with a fan on the bed.  HEENT:  She has severe  migraines all the time.  CARDIOVASCULAR:  Generally she does not have  trouble with chest pain or lower extremity edema.  RESPIRATORY:  She had  the shortness of breath with the pain earlier, but that is not a problem  she has frequently.  No hemoptysis.  GI: She does have occasional  heartburn but has not vomited blood or seen blood in her stool.  GU:  No  hematuria.  MUSCULOSKELETAL:  She was having trouble with severe left  calf pain for the last 2 weeks.  She did not have significant swelling  but she did have significant pain.  She does have joint pains and she  did have the calf pain.  NEUROLOGIC:  She is independent without any  paresthesias or asymmetric weakness.  She does report no energy and also  reports that she bruises very easily.  All of her other systems were  reviewed and are negative.   PHYSICAL EXAMINATION:  VITALS:  Her temperature was 97.9, pulse 83,  respiratory rate 18, blood pressure 118/68, O2 sats 97% on 2 L.  The patient is well-nourished, well-developed.  She is in no acute  distress.  HEENT EXAM:  Normocephalic, atraumatic.  Pupils are equal and round.  Her sclerae are nonicteric.  Oral mucosa moist.   NECK:  Supple.  Also she does have tenderness and swelling on the right  side of her neck.  She says that is the area where she had the  injection, but in the past when she has had those injections, he neck  did not remain so sore and swollen for such a long time after the  procedure.  She has difficulty or pain when she turns her neck.  There  is no cellulitis or skin change of the outside of her neck.  NEUROLOGIC:  She is alert and oriented x3.  She is attentive.  Her eyes  are somewhat glassy, likely because of the pain medicine she received  earlier, but she follows commands.  Her cranial nerves II-XII are intact  grossly.  She goes from supine to sitting without any difficulty.  She  has a normal gait.  She has grossly 5/5 strength in her upper and lower  extremities.  Sensory exam grossly intact.  SKIN:  Multiple bruises but otherwise is intact with no open lesions or  rashes.   DATA:  Her troponin was less than 0.05.  Sodium is 140, potassium 3.5,  chloride 103, bicarb 26, glucose 142, BUN 14, creatinine 0.8.  Her D-  dimer was low.  Her CBC:  5.3, hemoglobin 14.4, hematocrit 40.9,  platelet count 159.  Her EKG reveals normal sinus rhythm with normal  axis and intervals.  It appears to be essentially a normal EKG, heart  rate 72.   ASSESSMENT AND PLAN:  1. Chest pain in a diabetic, menopausal woman.  The plan is to admit      her overnight for 24-hour observation, and we will rule her out for      myocardial infarction for cardiac enzymes and because she is      diabetic, we will get a stress test in the morning.  Even though      her D-dimer was negative, she did have significant left calf pain      prior to this chest pain, so  we will also check a CT scan of her      chest to rule out pulmonary embolus as the pain was sudden-onset.  2. Significant pain and swelling of the neck 6 days post procedure.      We will check a CT of her neck as well.  3. For her diabetes, we will  continue her outpatient medication.  4. For her post-traumatic stress disorder and depression, we will      continue her outpatient medication once we get the list of her      outpatient medications, that is.  5. Also, the patient is still having headaches, so we will continue      her migraine medication as well.      Thomasenia Bottoms, MD  Electronically Signed     CVC/MEDQ  D:  04/06/2008  T:  04/07/2008  Job:  [id number]   cc:   Dr. Vernona Rieger [facility]

## 2011-02-04 NOTE — H&P (Signed)
[name], [name]                ACCOUNT NO.:  [id number]   MEDICAL RECORD NO.:  [id number]          PATIENT TYPE:  INP   LOCATION:  1824                         FACILITY:  [facility]   PHYSICIAN:  Elliot Cousin, M.D.    DATE OF BIRTH:  06-Oct-1960   DATE OF ADMISSION:  04/28/2007  DATE OF DISCHARGE:                              HISTORY & PHYSICAL   PRIMARY CARE PHYSICIAN:  Patient is unassigned.  She is followed by  multiple specialists at the [facility].   CHIEF COMPLAINT:  Chest pain.   HISTORY OF PRESENT ILLNESS:  The patient is a 50 year old woman with a  past medical history significant for non-Hodgkin's lymphoma, type 2  diabetes mellitus, and post-traumatic stress disorder.  She presents to  the emergency department with a chief complaint of chest pain.  The  chest pain started at approximately 5:30 this afternoon.  It occurred  while she was sitting in front of her computer.  The pain is located to  the left of center.  At the time that the pain occurred, she describes  it as a heaviness.  It was a 9/10 in intensity.  The pain radiated to  her left jaw and then to her left upper back around her shoulder.  She  had associated shortness of breath, clamminess, and sweatiness.  She  also had transient nausea and dry heaves.  When the EMS arrived, she was  given aspirin and two sublingual nitroglycerin tablets.  Her pain  decreased from a 9/10 in intensity to an 8/10 in intensity; however, en  route to the hospital, she was given another sublingual nitroglycerin,  which reduced her pain to a 2/10 in intensity.  The patient denies any  recent heavy lifting, heartburn, or cough.  She did fall on her left  side approximately 4 or 5 weeks ago.  She denies any history of rib  fractures.  She also had a sore throat and subjective fever 2-3 days  ago.  Both have now resolved.  The patient has chronic anxiety and  depression; however, she does not believe she had a panic attack today.   During the evaluation in the emergency department, the patient is noted  to be hemodynamically stable and afebrile.  Her EKG reveals a normal  sinus rhythm without any acute changes.  Her chest x-ray reveals mild  atelectasis.  Her initial cardiac markers are negative.  Her D-dimer is  less than 0.22.  Given the patient's risk factors, she will be admitted  to rule out a myocardial infarction.   PAST MEDICAL HISTORY:  1. Non-Hodgkin's lymphoma, diagnosed in 2006.  Status post      chemotherapy.  Her oncologist is Dr. Tonny Branch at the [facility].  2. Status post left subclavian Port-A-Cath.  3. Type 2 diabetes mellitus, diagnosed in 2007.  4. Post-traumatic stress disorder, anxiety, and depression.  Her      psychiatrist is Dr. Arvil Chaco at the [facility].  5. History of chronic anemia.  6. Question seizure disorder.  7. Migraine headaches.  8. Status post laparoscopic cholecystectomy in October, 2006.  9. Status post reconstructive jaw surgery in the past.  10.Status post hysterectomy for endometrial hyperplasia in October,      2003.  11.Status post bilateral tubal ligation in the past.   MEDICATIONS:  ALL OF THE PATIENT'S MEDICATIONS WERE RECORDED BY THE  REGISTERED NURSE IN THE EMERGENCY DEPARTMENT; HOWEVER, NONE OF THE DOSES  WERE RECORDED.  SOME OF THE DOSES THE PATIENT REMEMBERS AND SOME SHE  DOES NOT.  1. Aspirin 325 mg daily.  2. Divalproex, question dose, at bedtime.  3. Fioricet 2 tablets at the onset of a migraine and one tablet one      hour later if needed.  4. Keppra 750 mg b.i.d.  5. Liothyronine 2 pills nightly (new medication started last Friday).  6. Loratadine 10 mg daily.  7. Lorazepam 1 mg nightly and 1 mg daily p.r.n.  8. Metformin 500 mg nightly.  9. Multivitamin once daily.  10.Nortriptyline, question dose, nightly.  11.Omeprazole 20 mg daily.  12.Percocet 5 mg every 4 hours p.r.n.  13.Inderal 80 mg nightly.  14.Sertraline, question dose, daily.  15.Zomig as  needed.   ALLERGIES:  The patient has allergies to AMOXICILLIN, CIPRO, CODEINE,  SULFA, and MORPHINE.   SOCIAL HISTORY:  Patient is married.  She lives in [place].  She has  three children.  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; however, she has been out of work for four weeks now.  She is  seeking disability.  She is a veteran of the [place] War.  She denies  tobacco, alcohol, and illicit drug use.   FAMILY HISTORY:  The patient's father died of dementia at 48 years of  age.  He also had a history of two heart attacks.  Her mother is 27  years of age and has a history of diabetes mellitus and hypertension.   PHYSICAL EXAMINATION:  Temperature 97.2, blood pressure 107/66, pulse  73, respiratory rate 16, oxygen saturation 95% on room air.  GENERAL:  Patient is a 50 year old Caucasian woman who is currently  sitting in bed with a chief complaint of a severe headache from the  nitroglycerin.  HEENT:  Head is normocephalic and atraumatic.  Pupils are equal, round  and reactive to light.  Extraocular movements are intact.  Conjunctivae  are clear.  Sclerae are white.  Nasal mucosa is moist.  No sinus  tenderness.  Oropharynx reveals moist mucous membranes.  No posterior  exudates or erythema.  NECK:  Supple.  No adenopathy.  No thyromegaly.  No bruit.  No JVD.  LUNGS:  Clear to auscultation bilaterally with decreased breath sounds  in the bases.  HEART:  S1 and S2 with no murmurs, rubs or gallops.  CHEST WALL:  There is a palpable left subclavian Port-A-Cath,  nonerythematous. Mild general left wall tenderness.  ABDOMEN:  Mildly obese.  Positive bowel sounds.  Soft, nontender,  nondistended.  No hepatosplenomegaly.  No masses palpated.  EXTREMITIES:  Pedal pulses 2+ bilaterally.  No pretibial edema.  No  pedal edema.  NEUROLOGIC:  Patient is alert and oriented x3.  Cranial nerves II-XII  are intact.  Strength is 5/5 throughout.  Sensation is intact.    ADMISSION LABORATORY:  EKG and chest x-ray results as above.   Sodium 139, potassium 3.5, chloride 104, BUN 13, glucose 125, CO2 29.  Urine pregnancy test negative.  WBC 3.5, hemoglobin 13, platelets 163.  D-dimer less than 0.22.  Myoglobin 46.6.  CK-MB less than  1.  Troponin I  less than 0.05.  Valproic acid 57.3.  Urinalysis moderate leukocyte  esterase, 11-20 WBCs, bacteria few.   ASSESSMENT:  1. Chest pain, rule out myocardial infarction:  The patient's chest      pain appears to be typical; however, her EKG is completely normal,      and her cardiac markers are all negative.  Consider chest wall pain      and/or gastroesophageal reflux disease.  Of note, the patient did      fall a few weeks ago, which did lead to some chest pain.  She is      mildly tender to palpation.  Her D-dimer is less than 0.22;      therefore, pulmonary embolus is unlikely.  2. Pyuria without dysuria.  3. Mild leukopenia.  4. Acute-on-chronic headache:  The patient's worsening headache can be      attributed to the nitroglycerin drip which was started by the      emergency department physician.  5. Type 2 diabetes mellitus:  The patient's venous glucose is      currently 125.   PLAN:  1. Will discontinue the nitroglycerin drip.  Will start sublingual      nitroglycerin p.r.n.  2. Continue pain management with Dilaudid as needed.  Continue      Fioricet for headache management.  3. Oxygen therapy and supportive care.  4. Continue aspirin at 325 mg daily.  5. Will add prophylactic Protonix and Lovenox.  6. Continue most, if not all of the patient's chronic medications.  Of      note, some of the doses of the patient's medications are unknown.      The patient was asked to ask her husband to bring in medications      for review of her appropriate doses.  7. For further evaluation, will check cardiac enzymes q.8h. x3, TSH,      fasting lipid panel, hemoglobin A1C, BNP, and PTT/PT.  8. If the patient  rules out for a myocardial infarction, consider      discharging her to home with followup with her physician's at the      [facility].      Elliot Cousin, M.D.  Electronically Signed     DF/MEDQ  D:  04/28/2007  T:  04/29/2007  Job:  [id number]

## 2011-02-04 NOTE — Discharge Summary (Signed)
NAMEVICKYE, [name]                ACCOUNT NO.:  [id number]   MEDICAL RECORD NO.:  [id number]          PATIENT TYPE:  INP   LOCATION:  4741                         FACILITY:  [facility]   PHYSICIAN:  Madaline Savage, MD        DATE OF BIRTH:  [birth year]   DATE OF ADMISSION:  04/28/2007  DATE OF DISCHARGE:  04/30/2007                               DISCHARGE SUMMARY   PRIMARY CARE PHYSICIAN:  [place].  This patient is unassigned to [place].   DISCHARGE DIAGNOSES:  1. Atypical chest pain, ruled out acute coronary syndrome.  2. Non-Hodgkin's lymphoma.  3. Elevation of transaminases, likely chronic.  4. Diabetes mellitus, type 2.  5. Post-traumatic stress disorder.  6. Depression and anxiety.  7. History of migraine headaches.   DISCHARGE MEDICATIONS:  1. Aspirin 325 mg daily.  2. Divalproex at bedtime, as before.  3. Fioricet 2 tablets with migraine as needed.  4. Keppra 750 mg twice daily.  5. Liothyronine 25 micrograms at bedtime.  6. Ativan 1 mg at bedtime.  7. Metformin 200 mg daily.  8. Multivitamin tablet daily.  9. Nortriptyline 50 mg at bedtime.  10.Omeprazole 20 mg daily.  11.Percocet 5 mg every 4 hours as needed.  12.Inderal 80 mg at bedtime.  13.Sertraline 100 mg daily as needed.   HISTORY OF PRESENT ILLNESS:  For full history and physical, see the  history and physical dictated by Dr. Chandra Batch on April 28, 2007.  In  short, Ms. [name] is a 50 year old lady with the history of diabetes  mellitus and post-traumatic stress disorder, who comes in with chest  pain.  She was admitted to rule out an acute coronary syndrome.  After  admission, three sets of cardiac enzymes were done, which were negative.   PROBLEM LIST:  1. Chest pain.  She did have some typical features and some atypical      features in her chest pain.  Because she had a history of diabetes      mellitus, she was admitted to rule out an acute coronary syndrome.      An acute coronary syndrome has been ruled out  with three sets of      negative cardiac markers and, at this point in time, her chest pain      has improved and she says she gets it only very infrequently at      this time.  2. Elevated liver enzymes.  She had LFTs done here, which showed an      AST of 78 and an ALT of 94.  On discussing with her, she said she      has had elevated liver enzymes in the past, which she was told was      secondary to her lymphoma.  At this time, I would recommend further      evaluation at the [facility] as an outpatient.  3. Depression and anxiety.  She does have a flat affect.  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  At this point in time, she is back      to her baseline.  She has no focal deficits.   DISPOSITION:  At this time, she will be discharged home in a stable  condition.   FOLLOWUP:  She is asked to follow up with her doctors at the [facility] on  May 03, 2007.      Madaline Savage, MD  Electronically Signed     PKN/MEDQ  D:  04/30/2007  T:  04/30/2007  Job:  [id number]

## 2011-02-04 NOTE — Consult Note (Signed)
[name], [name]                ACCOUNT NO.:  [id number]   MEDICAL RECORD NO.:  [id number]          PATIENT TYPE:  INP   LOCATION:  3707                         FACILITY:  [facility]   PHYSICIAN:  Gerrit Friends. Dietrich Pates, MD, FACCDATE OF BIRTH:  01-13-1961   DATE OF CONSULTATION:  04/07/2008  DATE OF DISCHARGE:                                 CONSULTATION   PRIMARY CARE PHYSICIAN:  Dr. Loralyn [facility].   PRIMARY CARDIOLOGIST:  None.   CHIEF COMPLAINT:  Chest pain.   HISTORY OF PRESENT ILLNESS:  Ms. [name] is a 50 year old female with no  previous history of coronary artery disease.  She had sudden onset of  left-sided chest pain that radiated into her left shoulder as well as  into the left side of her jaw and through to her back.  She describes it  is a pressure at 10/10.  This is associated with shortness of breath,  diaphoresis, and nausea, but no vomiting.  This symptoms began on April 06, 2008, at approximately 6:15 p.m. at rest.  At first, she thought it  might be reflux and took 3 or 4 Tums, but her symptoms did not change.  She came to the emergency room at approximately 7 p.m. when her symptoms  did not resolve.  There, she received sublingual nitroglycerin x1, which  decreased her pain.  She also received Zofran, aspirin 81 mg x4, and IV  Dilaudid.  Her symptoms improved and she states that they eventually  resolved about 8:30 this morning and have not returned.  She had similar  symptoms in August and September 2008, but these were much worse.  She  reports decreased exercise tolerance since being on chemotherapy, but  does not get chest pain despite getting short of breath with exertion,  such as walking up a single flight of stairs. Currently, she is resting  comfortably.   PAST MEDICAL HISTORY:  1. Status post admission for chest pain in August 2008.  2. ER visit for chest pain in September 2008.  3. Diabetes, diet controlled.  4. Hyperlipidemia.  5. Family history of coronary artery disease.  6. History of non-Hodgkin lymphoma, diagnosed 3 years ago, and      chemotherapy completed.  7. Anemia.  8. History of CVA in 1992.  9. Post-traumatic stress disorder/anxiety/depression/panic attacks.  10.History of migraine, status post nerve block on March 31, 2008.   SURGICAL HISTORY:  She is status post left subclavian Port-A-Cath, total  vaginal hysterectomy, laparoscopic cholecystectomy, reconstructive jaw  surgery, bilateral tubal ligation.   ALLERGIES:  She is allergic or intolerance to PENICILLIN, CIPRO,  CODEINE, SULFA,  and MORPHINE.   CURRENT MEDICATIONS:  1. Protonix 40 mg a day.  2. BuSpar 10 mg b.i.d.  3. Zocor 20 mg a day.  4. Zoloft 200 mg a day.  5. Flexeril 10 mg t.i.d.  6. Ditropan 5 mg daily.  7. Klonopin 0.5 mg b.i.d.  8. Ativan 1.0 mg nightly.   SOCIAL HISTORY:  She lives in [place] with her husband and he is out  of work  from the post office, wants disability.  She is a Environmental manager.  She denies any history of alcohol, tobacco, or drug abuse.   FAMILY HISTORY:  Her mother is approximately 57 with no history of heart  disease, but has history of mini stroke, and her father died at age 47  with dementia, but also had heart attack in the past.  She has no  siblings with heart disease.   REVIEW OF SYSTEMS:  She has not been febrile.  The chest pain is  described above.  The dyspnea on exertion is chronic since chemotherapy  and has not improved.  She has occasional palpitations.  She denies  presyncope.  She occasionally coughs, but does not report any wheezing.  She has problems with being depressed about her apparent deconditioning  and also some anxiety issues as well.  She has some chronic arthralgias  and back pain.  She had nausea with the chest pain and has either  diarrhea or constipation, but feels like she gets either one of the  other all the time.  Her reflux symptoms are well controlled  with the  medication.  All 14-point review of systems is otherwise negative.   PHYSICAL EXAMINATION:  VITAL SIGNS:  Temperature 97.8, pulse 68,  respiratory rate 16, O2 saturation 92% on room air, and blood pressure  91/56.  GENERAL:  She is a well-developed, well-nourished white female, in no  acute distress.  HEENT:  Normal.  NECK:  There is no lymphadenopathy, thyromegaly, bruit, or JVD noted.  CV:  Her heart is regular in rate and rhythm with an S1, S2 and no  significant murmur, rub, or gallop is noted.  Distal pulses are intact  in all 4 extremities and no femoral bruits are appreciated.  LUNGS:  Clear to auscultation bilaterally.  SKIN:  No rashes or lesions are noted.  ABDOMEN:  Soft, nontender with active bowel sounds and no  hepatosplenomegaly by palpation.  EXTREMITIES:  There is no cyanosis, clubbing, or edema noted.  MUSCULOSKELETAL:  There is no joint deformity or effusion and no spine  or CVA tenderness.  NEURO:  She is alert and oriented.  Cranial nerves II through XII  grossly intact.   CT angiogram of the chest and neck was negative for PE.  She had ground  glass attenuation and bronchiectasis, which is possibly secondary to  previous infection or aspiration versus developing thrombosis.   EKG; sinus rhythm with rate 71 with no acute ischemic changes.   LABORATORY VALUES:  Hemoglobin 14.4, hematocrit 40.9, WBC 5.3, and  platelets 159.  Sodium 140, potassium 3.5, chloride 103, CO2 26, BUN 14,  creatinine 0.8, glucose 142.  D-dimer is less than 0.22.  CK-MB and  troponin I negative x3.   IMPRESSION:  Chest pain.  She has typical and atypical features.  Her  enzymes are negative and her EKG is not acute despite greater than 12  hours of pain.  However, she does have multiple cardiac risk factors.  Dr. Dietrich Pates  feels that she should get a heart catheterization, and this will be  scheduled for Monday.  We will add aspirin to her medication regimen and  an empiric  statin as well.  We will also check her lipid profile.  She  will be transferred to [facility] to await catheterization and Dr. Darnelle Catalan is  aware and agrees to the plan.      Theodore Demark, PA-C      Gerrit Friends. Rothbart,  MD, [facility]  Electronically Signed    RB/MEDQ  D:  04/07/2008  T:  04/08/2008  Job:  [id number]

## 2011-02-04 NOTE — Consult Note (Signed)
[name], [name]                ACCOUNT NO.:  [id number]   MEDICAL RECORD NO.:  [id number]          PATIENT TYPE:  INP   LOCATION:  3707                         FACILITY:  [facility]   PHYSICIAN:  Gerrit Friends. Dietrich Pates, MD, FACCDATE OF BIRTH:  [birth year]   DATE OF CONSULTATION:  04/07/2008  DATE OF DISCHARGE:                                 CONSULTATION      Theodore Demark, PA-C      Gerrit Friends. Dietrich Pates, MD, [facility]  Electronically Signed    RB/MEDQ  D:  04/07/2008  T:  04/08/2008  Job:  [id number]

## 2011-02-04 NOTE — H&P (Signed)
[name], [name]                ACCOUNT NO.:  [id number]   MEDICAL RECORD NO.:  [id number]          PATIENT TYPE:  INP   LOCATION:  1235                         FACILITY:  [facility]   PHYSICIAN:  Della Goo, M.D. DATE OF BIRTH:  1961-04-22   DATE OF ADMISSION:  01/02/2009  DATE OF DISCHARGE:                              HISTORY & PHYSICAL   DATE OF ADMISSION:  January 02, 2009   PRIMARY CARE PHYSICIAN[place]  [facility] [place]   CHIEF COMPLAINT:  Overdose.   HISTORY OF PRESENT ILLNESS:  This is a 50 year old female who was  brought emergently to the hospital after taking approximately 60-80  Ativan tablets which were 1 mg each at about 7:20 p.m.  The patient was  found by family member, and EMS was called.  The patient was interviewed  and stated that she just could not take it anymore and wanted to end it  all.  She states that she has both physical pain and mental pain.  The  amount of pills taken are reported as being variable.  One report states  20 Ativan pills were taken, and the patient herself reports taking more  than that and taking a handful of pills.  The patient would not state  what the exact triggers were to her behavior this evening.   PAST MEDICAL HISTORY:  Significant for history of post-traumatic stress  disorder, bipolar disorder, also history of type 2 diabetes mellitus,  hyperlipidemia, previous history of non-Hodgkin's lymphoma and a  cerebrovascular accident in 25.  Of note, the patient states that her  post-traumatic stress disorder originated from being a veteran of the  [place] War.   PAST SURGICAL HISTORY:  History of a total vaginal hysterectomy,  laparoscopic cholecystectomy, previous bilateral tubal ligation,  previous Port-A-Cath for chemotherapy treatment, reconstructive jaw  surgery and palate surgery, possibly an upper palatoplasty.   MEDICATIONS:  1. Lorazepam 1 mg t.i.d. p.r.n.  2. Verapamil 240 mg p.o.  q.h.s.  3. Omeprazole 20 mg two tablets once daily.  4. Conjugated estrogen 0.3 mg p.o. daily.  5. Metformin 500 mg p.o. b.i.d.  6. Seroquel 200 mg once daily.  7. Naprosyn 500 mg p.o. b.i.d.  8. Vicodin 5/500 p.o. t.i.d.  9. Rosuvastatin 20 mg p.o. q.h.s.  10.Vistaril 25 mg p.o. p.r.n. at bedtime.   ALLERGIES:  AMOXICILLIN, PENICILLIN, CIPROFLOXACIN, SULFA, CODEINE,  MORPHINE AND ZOMIG.  CODEINE AND MORPHINE CAUSE HIVES.  ALL THE OTHERS  LISTED CAUSE SHORTNESS OF BREATH.   SOCIAL HISTORY:  The patient is a nonsmoker, nondrinker and she denies  any illicit drug usage.   FAMILY HISTORY:  Positive for coronary artery disease in her mother.  Positive for hypertension and diabetes in her mother, brother and  sister, and she had a paternal great-grandmother with colon cancer.   REVIEW OF SYSTEMS:  The patient denies having any chest pain, shortness  of breath, nausea, vomiting, diarrhea, fevers, chills, night sweats,  weight loss.  Denies having any syncope, seizures, loss of  consciousness.  Denies having any myalgias or arthralgias.  She denies  having any delusions or confusion.  She denies having changes in her  bowel or bladder habits.   PHYSICAL EXAMINATION:  This is a 50 year old obese female in discomfort  but no acute distress currently.  VITAL SIGNS:  Temperature 98.2, blood pressure 136/77, heart rate 99,  respirations 16, O2 sats 92%.  HEENT:  Normocephalic, atraumatic.  Pupils equally round but sluggish  and reactive to light.  Extraocular movements are intact.  Funduscopic  benign.  Nares are patent bilaterally.  Oropharynx is clear.  NECK:  Supple, full range of motion.  No thyromegaly, adenopathy,  jugular venous distention.  CARDIOVASCULAR:  Regular rate and rhythm.  No murmurs, gallops or rubs.  LUNGS:  Clear to auscultation bilaterally.  ABDOMEN:  Positive bowel sounds, soft, nontender, nondistended.  EXTREMITIES:  Without cyanosis, clubbing or edema.   NEUROLOGIC:  The patient is alert and oriented x3.  There are no focal  deficits.   LABORATORY STUDIES:  White blood cell count 3.5, hemoglobin 12.7,  hematocrit 36.5, MCV 86.7, platelets 177, neutrophils 57%, lymphocytes  33%.  Sodium 139, potassium 3.7, chloride 108, CO2 22, BUN 9, creatinine  0.8, glucose 178.  Alcohol level is less than 5.  Acetaminophen level  less than 10.0.  Salicylate level less than 4.0.  Urine HCG negative.  Arterial blood gas with a pH of 7.439, PCO2 34.4, PO2 85.2, bicarb 22.9,  O2 saturation 97.1% on 2 liters nasal cannula.  Urine drug screen  positive for benzodiazepines.  An EKG was performed, results of which  revealed a normal sinus rhythm, nonspecific ST-segment changes and a  mild prolonged QT interval.   ASSESSMENT:  A 50 year old female being admitted with:  1. Benzodiazepine overdose.  2. Suicide attempt.  3. Depression.  4. Bipolar disorder history.   PLAN:  The patient will be admitted to an area for monitoring.  Suicide/safety precautions have been ordered and a one-to-one sitter for  this patient.  The patient will be monitored further for evidence of  possible respiratory depression.  She is maintaining her airway at this  time and is arousable.  She will be n.p.o. overnight and remain on IV  fluids.  DVT and GI prophylaxis have been ordered.  Her regular  medications will be further verified.  Psychiatric and behavioral health  services will be consulted.      Della Goo, M.D.  Electronically Signed     HJ/MEDQ  D:  01/02/2009  T:  01/02/2009  Job:  [id number]

## 2011-02-04 NOTE — Discharge Summary (Signed)
NAMEKAIJAH, ABTS                ACCOUNT NO.:  [id number]   MEDICAL RECORD NO.:  [id number]          PATIENT TYPE:  INP   LOCATION:  1235                         FACILITY:  [facility]   PHYSICIAN:  Eduard Clos, MDDATE OF BIRTH:  08/03/1961   DATE OF ADMISSION:  01/01/2009  DATE OF DISCHARGE:  01/03/2009                               DISCHARGE SUMMARY   COURSE IN THE HOSPITAL:  A 50 year old female with known history of  bipolar disorder, diabetes mellitus type 2, hypertension,  hyperlipidemia, who was brought in after the patient had intentionally  taken overdose of Ativan.  On admission the patient had a drug screen  which was positive for opiates and benzodiazepine.  The patient was  admitted to intensive unit and closely observed, placed on IV fluids and  a psychiatry consult was obtained with Dr. Jeanie Sewer.   Per Dr. Jeanie Sewer the patient will need an inpatient psychiatric  observation and treatment after medical clearance.  At this time as  patient is alert, awake, oriented and medically stable to be transferred  to psychiatry hospital at [facility].  At this time the  patient's symptoms have largely resolved and is medically stable, we  will transfer the patient to Behavioral Health once bed available.  AST  68 and ALT 62 which was stable.  At this time would recommend to repeat  liver function test.  During the stay LFTs were found to be mildly  elevated around 60s AST and ALT.  I would recommend closely following  LFTs and repeat complete metabolic panel, which includes an LFT on January 04, 2009.  At this time LFTs are stable and decreasing.  Will probably  reconsider starting her statin.  At the time of this dictation the  patient is hemodynamically stable.   PROCEDURES DONE DURING THIS STAY:  None.   PERTINENT LABS:  Hemoglobin/hematocrit on January 03, 2009 was 11.7 and  34.1, AST and ALT were 68 and 62.  Acetaminophen level and salicylate  levels were  undetectable.  Drug screen was positive for benzodiazepine  and opiates.   FINAL DIAGNOSIS:  1. Intentional drug overdose with Ativan with suicidal ideation.  2. Depression.  3. Diabetes mellitus type 2.  4. Hypertension.  5. Hyperlipidemia.  6. Mildly elevated liver function tests.  7. History of nonHodgkin's lymphoma.   MEDICATIONS AT TIME OF TRANSFER:  1. Protonix 40 mg p.o. daily.  2. Verapamil 480 mg p.o. at bedtime.  3. Metformin 500 mg p.o. twice daily.   PLAN:  The patient is to be transferred [facility] at [facility]  for further management of her psychiatric issues.  The patient is to be  on a cardiac healthy, carb-modified diet, to check her blood sugar a.c.  and h.s., to restart a statin if LFTs are showing a stable or decreasing  trend, to check CMET and CBC on January 04, 2009.  The patient is to be on  one-to-one sitter until cleared by psychiatrist.      Eduard Clos, MD  Electronically Signed     ANK/MEDQ  D:  01/03/2009  T:  01/03/2009  Job:  [id number]

## 2011-02-04 NOTE — H&P (Signed)
NAMECAITLYNN, [name]                ACCOUNT NO.:  [id number]   MEDICAL RECORD NO.:  [id number]          PATIENT TYPE:  IPS   LOCATION:  0303                          FACILITY:  [facility]   PHYSICIAN:  Jasmine Pang, M.D. DATE OF BIRTH:  02-23-1961   DATE OF ADMISSION:  01/04/2009  DATE OF DISCHARGE:                       PSYCHIATRIC ADMISSION ASSESSMENT   TIME OF ASSESSMENT:  1445.   IDENTIFYING INFORMATION:  A 50 year old female.  This is a voluntary  admission.   HISTORY OF PRESENT ILLNESS:  First [facility] admission for this 50 year old  with a history of post-traumatic stress disorder who presented after an  intentional overdose of approximately 60-80 Ativan tablets 1 mg each.  She was found by a family member and emergency medical services were  called.  She states that she just could not take it anymore; it being  both physical and mental pain.  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  Also having emotional  stressors, especially stress from significant medical issues, including  chronic headaches and she is pending surgical implant to treat the  chronic migraine headaches at [facility] later this  month.  She has endorsed suicidal thoughts and depression, felt that the  Seroquel was not controlling her post-traumatic stress disorder.   PAST PSYCHIATRIC HISTORY:  Her first [facility] admission, history of post-  traumatic stress disorder and bipolar disorder, veteran of the [place] War,  which is at least partially responsible for her post-traumatic stress.  Currently followed at the [facility] in [place] as  an outpatient.  She has denied a prior history of suicide attempts and  has denied a prior history of admission.   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  She has endorsed some stress with feeling that she had let her  husband and children down for the past  couple of years.  Has 3 children;  a daughter age 40, a 41 year old daughter, and an 72 year old daughter.  Has had significant stressors with problems with her physical health.  She reports a strong supportive relationship from her family.  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   FAMILY HISTORY:  Denies a family history of mental illness or substance  abuse.   MEDICAL HISTORY:  Primary care practitioner is the [facility].   MEDICAL PROBLEMS:  Include diabetes mellitus type 2, severe migraine  headaches followed at the [facility], post lorazepam overdose.   CURRENT MEDICATIONS:  Metformin hydrochloride 500 mg b.i.d., hydroxyzine  25 mg h.s. and p.r.n. for itching, lorazepam 1 mg p.r.n. for anxiety,  quetiapine 200 mg 1/2 tablet daily and 1/2 tablet p.r.n. for agitation  or flashbacks, rosuvastatin 20 mg 1/2 tablet at bedtime, diclofenac 75  mg b.i.d., verapamil 240 mg 2 tablets sustained release q.h.s.,  omeprazole 20 mg daily, estrogen 0.3 mg q.h.s., Compazine dose unclear,  and hydromorphone 4 mg q.4-6 h. p.r.n.  for severe migraine headache.   DRUG ALLERGIES:  AMOXICILLIN, CIPRO, CODEINE, PENICILLIN, SULFA,  MORPHINE AND ZOMIG.   PHYSICAL EXAM:  Was done on the medical unit and is unremarkable.  She  does have significant headache today with photophobia, phonophobia, and  headache pain 10/10, which she also scores as a typical migraine.  Headache history is limited due to severe headache.  Mental diagnostic  studies are detailed in the medical record.   She has mildly elevated liver enzymes:  SGOT 68, SGPT 62, alkaline  phosphatase 61, total bilirubin 0.4 and the medical unit had recommended  holding her rosuvastatin and for restart by her primary care physician.   MENTAL STATUS EXAM:  Revealed a fully alert female in significant pain  with a headache.  Admits  to being depressed.  Family is supportive.  Unable to give a lot of history today due to severe headache.  Her  cognition is fully intact.  No evidence of psychosis.  No homicidal  thoughts.   AXIS I:  Depressive disorder not otherwise specified, post-traumatic  stress disorder by history.  AXIS II:  No diagnosis.  AXIS III:  Migraine headache; diabetes mellitus type 2, controlled;  status post lorazepam overdose, dyslipidemia.  AXIS IV:  Deferred.  AXIS V:  Current 40.  Past year 59 estimated.   PLAN:  The plan is to voluntarily admit her to stabilize her depression  and alleviate her suicidal thoughts.  We hope to hear from her family,  get some additional input.  We are going to start her on Abilify 10 mg  daily for her PTSD.  She reports that the Seroquel did not help her and  she does not want to take it any longer.  We are treating her migraine  headache today with hydromorphone and Compazine validated with her  pharmacy, and will restart her Naprosyn 500 mg b.i.d.      Young Berry. Lorin Picket, N.P.      Jasmine Pang, M.D.  Electronically Signed    MAS/MEDQ  D:  01/05/2009  T:  01/05/2009  Job:  [id number]

## 2011-02-04 NOTE — Consult Note (Signed)
NAMEEFRAT, ZUIDEMA                ACCOUNT NO.:  [id number]   MEDICAL RECORD NO.:  [id number]          PATIENT TYPE:  INP   LOCATION:  1517                         FACILITY:  [facility]   PHYSICIAN:  Antonietta Breach, M.D.  DATE OF BIRTH:  Oct 07, 1960   DATE OF CONSULTATION:  01/02/2009  DATE OF DISCHARGE:  01/04/2009                                 CONSULTATION   REQUESTING PHYSICIAN:  [facility].   REASON FOR CONSULTATION:  Overdose on Ativan.   HISTORY OF PRESENT ILLNESS:  Mrs. [name] is a 50 year old female  admitted to the [facility] on January 01, 2009, due to a  benzodiazepine overdose.   She overdosed on 60-80 one mg tablets of Ativan.   Mrs. Ullom does have a number of general medical problems.  She is a  [place] [employer] veteran and suffers from post-traumatic stress disorder.  She  has experienced a recurrence of severe depression over 3 weeks which has  involved progressive anhedonia, low energy, poor concentration and  thoughts of suicide.  This was a suicide attempt.  She does have intact  orientation and memory function.  She is cooperative and noncombative.  She is not having any hallucinations or delusions.   PAST PSYCHIATRIC HISTORY:  1. She does have a history of post-traumatic stress disorder after      being a [employer].  She also has a history of bipolar      disorder.  2. She had been followed by the [place] in [place].  3. Her psychotropics as an outpatient have included Ativan 1 mg t.i.d.      as well as Seroquel 200 mg nightly.   FAMILY PSYCHIATRIC HISTORY:  None known.   SOCIAL HISTORY:  Mrs. Claudio is married.  She was in the [employer].  As  mentioned, she is a Loss adjuster, chartered.  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  She lives with her husband.  She does not use any alcohol or  illegal drugs.   PAST MEDICAL HISTORY:  1. Diabetes mellitus type 2.  2. History of  non-Hodgkin's lymphoma.  3. Cerebrovascular disease with a CVA in 1992.   TSH pregnancy test, aspirin, Tylenol and alcohol negative.   Urine drug screen positive for benzodiazepines and opiates.   MEDICATIONS:  Her MAR is reviewed.  She is currently on Ativan 0.5 mg  t.i.d. p.r.n.   She is allergic to:  1. MORPHINE SULFATE.  2. CIPROFLOXACIN.  3. AMOXICILLIN.  4. SULFA.  5. CODEINE.   REVIEW OF SYSTEMS:  CONSTITUTIONAL:  HEAD, EYES, EARS, NOSE, THROAT,  Mouth, neurologic, psychiatric, cardiovascular, respiratory,  gastrointestinal, genitourinary, skin, musculoskeletal, hematologic,  lymphatic, endocrine, metabolic:  All unremarkable.   PHYSICAL EXAMINATION:  VITAL SIGNS:  Temperature 98.1, pulse 81,  respiratory rate 20, blood pressure 107/62, O2 saturation on room air  94%.  GENERAL APPEARANCE:  Mrs. Casebeer is a middle-aged female partially  reclined in a supine position in her hospital bed with no abnormal  involuntary movements.  MENTAL STATUS EXAM:  Mrs. Wissink is alert.  Her eye contact is good.  Her affect is very constricted.  Her mood is profoundly depressed.  She  is oriented to all spheres.  Her concentration is decreased.  Her memory  function is intact to immediate, recent and remote except for the  overdose blackout.  Fund of knowledge and intelligence are within normal  limits.  Speech involves normal rate.  There is a slightly flat prosody.  There is no dysarthria.  Thought process is logical, coherent, goal-  directed, no looseness of associations.  Thought content:  She  acknowledges suicidal intent and suicidal ideation.  Her insight is  partial, her judgment is impaired.   ASSESSMENT:  Axis I:  1.  293.83 - mood disorder not otherwise  specified, depressed (idiopathic and general medical factors).  2.  Rule  out bipolar disorder, depressed.  3.  Post-traumatic stress disorder.  Axis II:  Deferred.  Axis III:  See past medical history.  Axis IV:  1.   History of severe trauma.  2.  General medical.  Axis V:  Global Assessment of Functioning 20.   RECOMMENDATIONS:  1. Would admit Mrs. Sohail to an inpatient psychiatric unit for      further evaluation and treatment.  2. For anti-acute anxiety, would proceed with Ativan 0.5 mg t.i.d.      p.r.n. with caution regarding slurred speech or ataxia.  3. Would continue with ego-supportive psychotherapy.  4. The undersigned provided ego-supportive psychotherapy and      education.  5. Would admit to an inpatient psychiatric unit for further evaluation      and treatment as soon as possible.      Antonietta Breach, M.D.  Electronically Signed     JW/MEDQ  D:  03/11/2009  T:  03/11/2009  Job:  [id number]

## 2011-02-07 NOTE — Op Note (Signed)
[facility]  Patient:    [name], [name] Visit Number: [id number] MRN: [id number]          Service Type: DSU Location: [facility] Attending Physician:  Soledad Gerlach Dictated by:   Guy Sandifer Arleta Creek, M.D. Proc. Date: 10/15/01 Admit Date:  10/15/2001                             Operative Report  PREOPERATIVE DIAGNOSIS:       Menorrhagia.  POSTOPERATIVE DIAGNOSIS:      Menorrhagia.  OPERATION:                    Hysteroscopy with resectoscope, dilatation and curettage.  SURGEON:                      Guy Sandifer. Arleta Creek, M.D.  ANESTHESIA:                   General with LMA by                               Ellison Hughs., M.D.  ESTIMATED BLOOD LOSS:         Less than 50 cc.  I&O:                          Sorbitol distending media, 50 cc deficit.  INDICATIONS AND CONSENT:      This patient is a 50 year old married white female, G3, P3, status post tubal ligation with increasingly heavy menstrual flows.  For details see dictated history and physical.  Hysteroscopy with resectoscope and dilatation and curettage is discussed with the patient. Potential risks and complications have been discussed including but not limited to infection, uterine perforation and organ damage, bleeding requiring transfusion and blood products, possible transfusion reaction, HIV and hepatitis acquisition, DVT, PE and pneumonia, hysterectomy, laparotomy, and laparoscopy.  All questions were answered and consent signed on the chart.  FINDINGS:                     The endometrial canal contains a large amount of shaggy endometrium.  Following dilatation and curettage reinspection with the hysteroscope reveals an approximately 1 cm polypoid-type structure in the left uterine fundus.  DESCRIPTION OF PROCEDURE:     The patient was taken to the operating room where she was placed in the dorsal supine position.  She was given intravenous antibiotics.   General anesthesia has been induced via LMA.  She was then placed in the dorsal lithotomy position where she was prepped, bladder straight catheterized and she was draped in the sterile fashion.  The bivalve speculum was placed in the vagina and anterior cervical lip is injected with 1% Xylocaine.  This is then grasped with a single-tooth tenaculum.  A paracervical block with 1% Xylocaine is then placed at the 2, 4, 5, 7, 8, and 10 oclock positions with 20 cc of 1% Xylocaine.  Cervix is gently progressively dilated to a 33 Pratt dilator.  The resectoscope is then placed in the cervical canal and advanced under direct visualization using sorbitol distending media.  Shaggy endometrium is noted obscuring the total view. Therefore, the hysteroscope is withdrawn.  Sharp curettage is carried out. The hysteroscope is then reintroduced and advanced again under direct visualization.  An  approximately 1 cm of less polypoid-type structure is noted in the left uterine fundus.  This is then resected with a single right angle wire loop without difficulty.  This is sent separately to pathology.  The procedure is then ended.  The single-tooth tenaculum is removed.  Scant bleeding is noted.  All instruments are removed from the vagina.  All counts are correct.  The patient is awakened and taken to the recovery room in stable condition.    DESCRIPTION OF PROCEDURE: Dictated by:   Guy Sandifer. Arleta Creek, M.D. Attending Physician:  Soledad Gerlach DD:  10/15/01 TD:  10/16/01 Job: [id number]

## 2011-02-07 NOTE — Discharge Summary (Signed)
[name], [name]                ACCOUNT NO.:  [id number]   MEDICAL RECORD NO.:  [id number]          PATIENT TYPE:  INP   LOCATION:  3707                         FACILITY:  [facility]   PHYSICIAN:  Michelene Gardener, MD    DATE OF BIRTH:  04-05-1961   DATE OF ADMISSION:  04/06/2008  DATE OF DISCHARGE:  04/08/2008                               DISCHARGE SUMMARY   DISCHARGE DIAGNOSES:  1. Noncardiac chest pain.  2. Diabetes mellitus.  3. History of post-traumatic stress disorder.  4. History of non-Hodgkin's lymphoma.  5. History of severe migraine headache for which the patient is      following at [facility].  6. The patient is menopausal and had hot flashes for the last 2 years.  7. History of her depression and anxiety.   DISCHARGE MEDICATIONS:  The patient will continue all preadmission  medications that include:  1. Buspirone 10 mg twice daily.  2. Simvastatin 20 mg once a day.  3. Zocor 100 mg once a day.  4. Omeprazole 20 mg once a day.  5. Flexeril 10 mg three times daily as needed.  6. Oxybutynin 5 mg once a day.  7. Diclofenac 75 mg twice daily.  8. Clonazepam 0.5 mg twice daily.  9. Lorazepam 1 mg as needed.  10.Percocet as needed, no prescription was given.  11.Diphenhydramine 25 mg as needed.   Of note, no prescription was given to the patient on all the  medications.  The patient was advised to follow with her primary doctor  if prescriptions are needed.   CONSULTATIONS:  Cardiology consult.   PROCEDURE:  Myoview stress test on April 08, 2008, showed no abnormal  findings.   COURSE OF HOSPITALIZATION:  This is a 50 year old female presented to  the hospital with chest pain.  She was admitted to the hospital for  further evaluation.  Three sets of troponin and cardiac enzymes were  done and they came to be normal.  Serial EKG showed no evidence of acute  problem.  Cardiology evaluation was done and the patient was taken for a  stress test on April 08, 2008, that showed  no evidence of acute ischemia.  The patient was  cleared by cardiology for discharge.  She was sent home on all  preadmission medications.  She was advised to follow with her primary  doctor in around 1 week.   Assessment time is 1 hour.      Michelene Gardener, MD  Electronically Signed     NAE/MEDQ  D:  04/19/2008  T:  04/20/2008  Job:  [id number]   cc:   Dr. Blain Pais

## 2011-02-07 NOTE — H&P (Signed)
NAME:  [name], [name]                          ACCOUNT NO.:  [id number]   MEDICAL RECORD NO.:  [id number]                   PATIENT TYPE:  INP   LOCATION:  NA                                   FACILITY:  [facility]   PHYSICIAN:  Tracie Harrier, M.D.              DATE OF BIRTH:  12-20-1960   DATE OF ADMISSION:  07/11/2002  DATE OF DISCHARGE:                                HISTORY & PHYSICAL   HISTORY OF PRESENT ILLNESS:  The patient is a 50 year old female gravida 3  para 3 status post bilateral tubal ligation.  The patient is admitted at  this time for definitive therapy for her history of abnormal uterine  bleeding and associated anemia.  In January 2003 she underwent a  conservative surgery for this whereby she underwent a hysteroscopy and  resectoscope.  This did show endometrial hyperplasia without atypia.  She  was placed on Prometrium after the surgery to control her bleeding, which  has been unsuccessful.  She also had endometrial polyps at that time of her  recent surgery.  She also incidentally had laparoscopy at that time for an  ovarian cyst.   The patient continues to bleed abnormally on a monthly basis.  She requests  definitive therapy now in the form of hysterectomy.  She requests ovarian  conservation.  Hemoglobin on July 11, 2002 in our office is 9.0.  She had  an endometrial biopsy in May which was benign secretory endometrium.  Informed consent was given; also, treatment options were reviewed with her.   MEDICAL HISTORY:  History of endometrial hyperplasia and abnormal uterine  bleeding.   SURGICAL HISTORY:  1. Laparoscopy, hysteroscopy, and D&C in 2003.  2. Bilateral tubal ligation in 1999.  3. Tonsillectomy.  4. Jaw surgery.  5. Knee surgery.   CURRENT MEDICATIONS:  1. Tylenol Sinus p.r.n.  2. Iron supplementation.   OBSTETRICAL HISTORY:  Normal spontaneous vaginal delivery x3 at term.   FAMILY HISTORY:  Bone cancer in paternal grandmother.   SOCIAL  HISTORY:  Negative for tobacco, alcohol, or illicit drug use.   ALLERGIES:  PENICILLIN, AMPICILLIN, CIPROFLOXACIN, SULFA, and CODEINE.   PHYSICAL EXAMINATION:  VITAL SIGNS:  Stable, blood pressure 112/60,  temperature 97.8.  GENERAL:  She is a well-developed, well-nourished female in no acute  distress.  HEENT:  Within normal limits.  NECK:  Supple without adenopathy or thyromegaly.  HEART:  Regular rate and rhythm without murmur, gallop, or rub.  LUNGS:  Clear to auscultation.  BREASTS:  Show no masses, nipple discharge, or axillary adenopathy.  ABDOMEN:  Soft and benign without masses, tenderness, hernia, or  organomegaly.  PELVIC:  Normal external female genitalia, vagina and cervix clear.  The  uterus is small, nontender, and mobile.  The adnexa is clear of mass.   ADMITTING DIAGNOSES:  1. Abnormal uterine bleeding.  2. History of endometrial hyperplasia without atypia.  3. Anemia.  PLAN:  1. Total vaginal hysterectomy.  2. Ovarian conservation.   DISCUSSION:  The risks and benefits of this surgery explained to the  patient.  The risks of bleeding, infection, risk of injury to surrounding  organs was reviewed.  The patient was allowed to ask questions and wished to  proceed.  The risk of laparotomy reviewed and ovarian conservation  discussed.                                               Tracie Harrier, M.D.    REG/MEDQ  D:  07/11/2002  T:  07/11/2002  Job:  [id number]

## 2011-02-07 NOTE — H&P (Signed)
[name], [name]                ACCOUNT NO.:  [id number]   MEDICAL RECORD NO.:  [id number]          PATIENT TYPE:  INP   LOCATION:  1825                         FACILITY:  [facility]   PHYSICIAN:  Wilmon Arms. Corliss Skains, M.D. DATE OF BIRTH:  07/20/1961   DATE OF ADMISSION:  07/16/2005  DATE OF DISCHARGE:                                HISTORY & PHYSICAL   CHIEF COMPLAINT:  Right upper quadrant abdominal pain.   HISTORY OF PRESENT ILLNESS:  The patient is a 50 year old female with a past  medical history significant for non-Hodgkin's lymphoma for which the patient  recently completed a course of chemotherapy. The patient has had several  hours of intense right upper quadrant and epigastric abdominal pain  associated with some nausea but no vomiting. She had a normal bowel movement  yesterday. Her last meal was at 7 p.m. on July 15, 2005. The pain awoke  her from sleep around 3 a.m. She presented to the emergency department for  further evaluation. She was noticed to be afebrile. She underwent a workup  which included an ultrasound which showed gallstones but a normal  gallbladder wall thickness. She had a positive sonographic Murphy's sign.  The biliary tree appeared normal. The patient also underwent a CT scan which  showed persistent retroperitoneal lymphadenopathy related to her non-  Hodgkin's lymphoma. The gallbladder showed stones but no obvious wall  thickening. Her pancreas also appeared normal.   ALLERGIES:  1.  PENICILLIN.  2.  CIPRO.  3.  SULFA.  4.  CODEINE.   MEDICATIONS:  1.  Coumadin 1 mg p.o. daily.  2.  Klonopin.  3.  Zoloft.   PAST MEDICAL HISTORY:  Non-Hodgkin's lymphoma.   PAST SURGICAL HISTORY:  1.  UPPP.  2.  Reconstructive jaw surgery.  3.  Hysterectomy.  4.  Left subclavian Port-A-Cath placement.   SOCIAL HISTORY:  Nonsmoker, nondrinker.   PHYSICAL EXAMINATION:  VITAL SIGNS:  Temperature 96.1, pulse 74,  respirations 20, blood pressure 132/81, 100%  saturation on room air.  GENERAL:  This is a well-developed, well-nourished female in no apparent  distress.  HEENT:  EOMI, sclerae anicteric.  NECK:  No masses, no bruits. Trachea is midline.  LUNGS:  Clear to auscultation bilaterally. Normal respiratory effort.  HEART:  Regular rate and rhythm.  ABDOMEN:  Positive bowel sounds, slightly distended, soft, tender in the  right upper quadrant and epigastrium. There is a positive Murphy's sign.  There is some involuntary guarding.  SKIN:  Shows no sign of jaundice, warm and dry.   LABORATORY DATA:  White count 2.0, hemoglobin 12.5, platelet count 164.  Electrolytes are within normal limits. LFTs showed bilirubin of 0.5, lipase  is 27. Ultrasound and CT scan as noted above in HPI.   IMPRESSION:  Acute cholecystitis.   PLAN:  Recommend laparoscopic cholecystectomy. I discussed the benefits and  risks of the procedure with the patient. She understands the risks including  the possibility for need to convert to open procedure. With her  immunosuppression, she is at slightly increased risk for complications due  to surgery. She understands  this and wishes to proceed.      Wilmon Arms. Tsuei, M.D.  Electronically Signed     MKT/MEDQ  D:  07/16/2005  T:  07/16/2005  Job:  [id number]

## 2011-02-07 NOTE — H&P (Signed)
[facility]  Patient:    [name], [name] Visit Number: [id number] MRN: [id number]          Service Type: Attending:  Guy Sandifer. Arleta Creek, M.D. Dictated by:   Guy Sandifer Arleta Creek, M.D. Adm. Date:  10/15/01                           History and Physical  CHIEF COMPLAINT:              Menorrhagia.  HISTORY OF PRESENT ILLNESS:   This patient is a 50 year old married white female, G3, P3, status post tubal ligation, who complains of extremely heavy menstrual flows.  They keep her in the house for one or two days out of each month.  She has had documented anemia in the office down to 9.8.  She has been on iron therapy for this.  On ultrasound examination she has a uterus measuring 10.3 x 6.6 x 5.2 cm.  The ovaries are normal.  On sonohistogram there is a 9 mm structure consistent with a possible polyp.  This was discussed with the patient.  Hysteroscopy with resectoscope and dilatation and curettage were discussed.  Potential risks and complications have been reviewed, and all questions have been answered.  PAST MEDICAL HISTORY:         Negative.  PAST SURGICAL HISTORY:        1. Tonsillectomy in 1992.                               2. Jaw surgery in 1995.                               3. Knee surgery in 1987.                               4. Laparoscopy with tubal ligation with Filshie                                  clips in 1999.  MEDICATIONS:                  Iron supplementation and Tylenol Sinus as needed.  ALLERGIES:                    PENICILLIN, AMPICILLIN, and AMOXICILLIN, all leading to breathing troubles.  CIPRO, leading to breathing troubles and rash. SULFA, leading to rash.  CODEINE, leading to rash.  SOCIAL HISTORY:               The patient denies tobacco, alcohol, or drug abuse.  FAMILY HISTORY:               1. Breast lumps in sister.                               2. Bone cancer in paternal grandmother.  OBSTETRICAL HISTORY:           Vaginal delivery x 3.  REVIEW OF SYSTEMS:            Negative except as above.  PHYSICAL EXAMINATION:  VITAL SIGNS:  Height 5 feet 8 inches, weight 156 pounds. Blood pressure 120/78.  HEENT:                        Without thyromegaly.  LUNGS:                        Clear to auscultation.  HEART:                        Regular rate and rhythm.  BACK:                         Without CVA tenderness.  BREASTS:                      Not examined.  ABDOMEN:                      Soft and nontender, without masses.  PELVIC:                       Vulva, vagina, and cervix without lesion. Uterus normal size, nontender.  Adnexa nontender, without masses.  EXTREMITIES:                  Grossly within normal limits.  NEUROLOGIC:                   Grossly within normal limits.  ABDOMEN:                      Menorrhagia.  PLAN:                         Hysteroscopy and D&C. Dictated by:   Guy Sandifer Arleta Creek, M.D. Attending:  Guy Sandifer Arleta Creek, M.D. DD:  10/03/01 TD:  10/03/01 Job: [id number]

## 2011-02-07 NOTE — Discharge Summary (Signed)
NAMEMERIT, GADSBY                ACCOUNT NO.:  [id number]   MEDICAL RECORD NO.:  [id number]          PATIENT TYPE:  OBV   LOCATION:  4729                         FACILITY:  [facility]   PHYSICIAN:  Waldemar Dickens, MD     DATE OF BIRTH:  [birth year]   DATE OF ADMISSION:  02/27/2009  DATE OF DISCHARGE:  02/28/2009                               DISCHARGE SUMMARY   DISCHARGE DIAGNOSES:  1. Chest pain.  2. Bradycardia.  3. Diabetes, hemoglobin globin A1c of 7 in April 2010.  4. Hyperlipidemia.  5. History of non-Hodgkin lymphoma in remission.  6. History of suicide attempt in May 2010 with Ativan.  7. Elevated liver function tests.  8. Depression.   DISCHARGE MEDICATIONS:  1. Verapamil 240 mg 1 tablet p.o. at bedtime.  2. Omeprazole 20 mg 1 tablet p.o. daily.  3. Conjugated estrogen 0.3 mg 1 tablet p.o. daily.  4. Metformin 500 mg 1 tablet p.o. daily.  5. Rosuvastatin 20 mg 1 tablet p.o. daily.  6. Vitamin D3 200 units 1 tablet p.o. daily.   CONDITION AT DISCHARGE:  The patient was medically stable to be  discharged.  The patient's chest pain resolved next day of admission.  Workup was negative.  The patient will follow up with primary care  physician, Dr. Osvaldo Shipper on June 23 at 9:30 a.m.   PROCEDURES:  1. Chest x-ray.  Impression:  No acute cardiopulmonary disease.  2. CT of the head without contrast.  Impression:  No acute      intracranial abnormalities.   ADMITTING HISTORY AND PHYSICAL AND HISTORY OF PRESENT ILLNESS:  A 50-  year-old woman with past medical history of diabetes type 2, hemoglobin  A1c of 7 on April 10, hypertension, and hyperlipidemia, comes to the ED  complaining of chest pain.  The patient states after walking 3 blocks  she experienced sudden chest pain, described as pressure like, localized  to midsternal area, 6/10 intensity, that persisted since 8 a.m. on day  of admission.  Associated with diaphoresis, shortness of breath, and  feeling like her heart was  not beating.  The symptoms have also  persisted since 8 in the morning.  She also reports headache localized  to parietal area and since being in the hospital, states that she has  had blurry vision and numbness of the right face.  Denies to have  started any new medication, no use of NSAIDs, no association with  eating, no coughing, no sick contacts, and no weakness.  The patient  states that current episode does not feel like previous anxiety attacks.   PHYSICAL EXAMINATION:  VITAL SIGNS:  Temperature 98.1, blood pressure  101/49, pulse 48, respiratory rate 16, and O2 saturation 96 on 2 L.  GENERAL:  NAD.  EYES:  EOMI, nonicteric sclera, dilated pupils.  ENT:  Moist mucous membranes, nonerythematous pharynx.  NECK:  No JVD, no carotid bruits.  RESPIRATORY:  Clear to auscultation bilaterally.  No wheezes, rhonchi,  or crackles.  CARDIOVASCULAR:  Huston Foley, but regular.  No murmurs, rubs, or gallops.  GI:  Bowel sounds positive, soft and  depressible, nontender, and  nondistended.  No rebound.  EXTREMITIES:  No edema.  SKIN:  No rash.  Scars noted on the occipital area, cervical area, and  right lower back which are chronic.  LYMPH:  No lymphadenopathy.  MUSCULOSKELETAL:  No joint abnormalities.  NEUROLOGIC:  Alert and oriented x3.  Cranial nerves II through XII  intact, 5/5 strength throughout, light touch sensation intact, finger-to-  nose normal, decreased sensation of the right side of her face.   ADMISSION LABORATORIES:  Sodium 141, potassium 4.5, chloride 106, bicarb  27, BUN 11, creatinine 0.76, and glucose 148.  White blood cells 4.9,  hemoglobin 14, hematocrit 40.7, and platelets 187.   HOSPITAL COURSE:  1. Chest pain.  The patient with multiple risk factors, which included      diabetes and hyperlipidemia.  Primary concern was acute coronary      syndrome.  The patient's cardiac enzymes were negative throughout      hospitalization.  EKG; the patient did not have any signs of  acute      coronary syndrome.  On the second day of admission, the patient's      symptoms had resolved.  The patient had a 2-D echo that showed left      ventricle size was normal.  Systolic function was normal.  Ejection      fraction was indurated 60% to 65%.  Wall motion was normal.  There      were no regional wall motion abnormalities.  Aortic valve had      trivial regurgitation.  After full medical workup, the patient was      thought to be medically stable to be discharged.  The patient will      follow up with primary care physician and would recommend that the      patient should get a stress test.  The symptoms may have been due      to anxiety.  2. Bradycardia was most likely secondary to verapamil.  The patient      takes verapamil for headaches.  Telemetry showed that the patient's      heart rate was between 58 and 60 on the day of discharge.  The      patient's verapamil would be decreased.  The patient will follow up      with primary care physician.  Increased intracranial pressure can      cause bradycardia and thus a CT of the head was done.  CT results      as stated above.  3. Diabetes type 2 was controlled throughout hospitalization.  The      patient will be discharged on home dose regimen.  Further      management will be done by primary care physician.  4. Hyperlipidemia.  The patient will continue statin.  Further      management by primary care physician.  5. Transaminase was stable.  It is recommended that the patient get      hepatitis panel on followup.  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  6. Hypotension was thought to be secondary to verapamil.  Orthostatics      were negative.  We will decrease calcium channel blockers and      followup as an outpatient.   Review of ED records shows that the patient had a consistent systolic  blood pressure, usually close to 100.  We will monitor closely as an  outpatient.   DISCHARGE VITAL SIGNS:  Temperature  97.7, blood pressure 104/58, pulse  64, respiratory rate 18, and O2 saturation 95 on room air.   DISCHARGE LABORATORIES:  Sodium 142, potassium 3.9, chloride 108, bicarb  24, BUN 11, creatinine 0.81, and glucose 127.  White blood cells 3.1,  hemoglobin 13.4, hematocrit 37.5, and platelets 148.      Danne Harbor, MD  Electronically Signed      Waldemar Dickens, MD     RV/MEDQ  D:  03/05/2009  T:  03/06/2009  Job:  [id number]

## 2011-02-07 NOTE — Discharge Summary (Signed)
[name], [name] NO.:  [id number]   MEDICAL RECORD NO.:  [id number]          PATIENT TYPE:  IPS   LOCATION:  0303                          FACILITY:  [facility]   PHYSICIAN:  Jasmine Pang, M.D. DATE OF BIRTH:  30-Jul-1961   DATE OF ADMISSION:  01/04/2009  DATE OF DISCHARGE:  01/06/2009                               DISCHARGE SUMMARY   IDENTIFICATION:  This is a 50 year old married female who was admitted  on a voluntary basis on January 04, 2009.   HISTORY OF PRESENT ILLNESS:  This is the first [facility] admission for this 32-  year-old with a history of posttraumatic stress disorder.  She presented  after an intentional overdose of approximately 60-80 Ativan tablets 1 mg  each.  She was found by a family member and emergency medical services  were called.  She states that she just could not take it any more.  It  being both physical and mental pain.  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  She also is  having emotional stressors including medical issues (chronic migraine  headaches treated at [facility].  They are planning to implant  surgically a device to help her with these headaches.)  For further  admission information, see psychiatric admission assessment.   PHYSICAL EXAMINATION:  They were no acute physical or medical problems  noted.  Her physical examination was done on the medical unit and is  unremarkable.  She was noted to have a significant headache on the day  of admission with photophobia, phonophobia and headache pain 10/10 which  she scores as a typical migraine.   LABORATORY DATA:  She has mildly elevated liver enzymes, SGOT is 68,  SGPT is 62, alkaline phosphatase is 61, total bilirubin 0.4.   HOSPITAL COURSE:  Upon admission, patient was started on Ambien 5 mg  p.o. q.h.s. p.r.n. insomnia, Protonix 40 mg daily, Verapamil 480 mg at  h.s., metformin 500 mg p.o. b.i.d.  On January 04, 2009 due  to her severe  headache, she was given Vicodin 5/325 two pills p.o. x1.  On January 05, 2009, she was started on Abilify 10 mg p.o. q.h.s.  She was also started  on hydromorphone 4 mg p.o. now and q.4 h. p.r.n. headache and Compazine  5 mg p.o. q.4 h. p.r.n. nausea to give with the hydromorphone at the  first dose.  In addition, she was started on Naprosyn 500 mg p.o. b.i.d.  The patient tolerated these medications well.  At first, she did not  want to go on medications, but was willing to start Abilify.  She  described mood swings as being a problem.  On January 06, 2009, there was  a family session with patient and patient's husband, and our family  therapist.  Main concerns addressed were for discharge planning.  Continued care was discussed with the patient upon discharge.  Her  husband said She is her old self again.  The patient plans to return  home and return to her therapist, Annia Friendly  and psychiatrist, Dr.  Cheri Rous.  She also spoke about needing time to herself and time with  just her husband.  Both have discussed a plan to make this happen and  decrease her stress at home.  She was not feeling suicidal and her  husband felt she would be safe and would not harm herself upon returning  home.   On January 06, 2009, her mood was less depressed, less anxious.  Affect  was consistent with mood.  There was no suicidal or homicidal ideation.  No thoughts of self-injurious behavior.  No auditory or visual  hallucinations.  No paranoia or delusions.  Thoughts were logical and  goal directed.  Thought content, no predominant theme.  Cognitive was  grossly intact.  Insight good.  Judgment good.  Impulse control good.  The patient wanted to go home and was felt to be safe for discharge.   DISCHARGE DIAGNOSES:  AXIS I:  Mood disorder not otherwise specified.  Also posttraumatic stress disorder, chronic by history.  AXIS II:  No diagnosis.  AXIS III:  Migraine headache, diabetes mellitus  type 2 controlled,  dyslipidemia.  AXIS IV:  Moderate (problems with primary support group, burden of  psychiatric illness, burden of medical problems).  AXIS V:  Global assessment of functioning was 50 upon discharge, global  assessment of functioning was 40 upon admission.  Global assessment of  functioning highest in the past year was 68.   DISCHARGE PLANS:  There was no specific activity level or dietary  restrictions.   POST-HOSPITAL CARE PLANS:  1. The patient will see Annia Friendly, therapist on January 09, 2009 at      9:30 a.m.  2. She will also see Dr. Cheri Rous for her psychiatric medications.  3. She is also to see her migraine specialist on January 09, 2009 at 10      a.m.   DISCHARGE MEDICATIONS:  1. Glucophage 500 mg twice daily with meals.  2. Verapamil 480 mg at bedtime.  3. Abilify 10 mg at bedtime.  4. Naproxen 500 mg twice daily.  5. Ambien 5 mg at bedtime if needed for insomnia.  6. Dilaudid 4 mg every 4 hours as needed for pain.  7. She is to restart her Rosuvastatin after her primary care doctor      has a chance to look at her liver profile.  8. She is to continue her omeprazole as directed.  9. Conjugated estrogen as directed  10.Diclofenac NA as directed.      Jasmine Pang, M.D.  Electronically Signed     BHS/MEDQ  D:  01/24/2009  T:  01/24/2009  Job:  [id number]

## 2011-02-07 NOTE — Discharge Summary (Signed)
NAME:  [name], [name]                          ACCOUNT NO.:  [id number]   MEDICAL RECORD NO.:  [id number]                   PATIENT TYPE:  INP   LOCATION:  9128                                 FACILITY:  [facility]   PHYSICIAN:  Tracie Harrier, M.D.              DATE OF BIRTH:  1961/04/21   DATE OF ADMISSION:  07/12/2002  DATE OF DISCHARGE:  07/14/2002                                 DISCHARGE SUMMARY   HISTORY OF PRESENT ILLNESS:  The patient is a 50 year old female gravida 3,  para 3 status post bilateral tubal ligation.  She is admitted for definitive  therapy of a long history of abnormal uterine bleeding and associated  anemia.  She has undergone conservative treatment of this surgically and  medical treatment of this which has been unsuccessful.  She recently had an  endometrial biopsy which showed benign secretory endometrium.  She has been  on iron for quite a while.   PAST MEDICAL HISTORY:  1. History of endometrial hyperplasia.  2. History of abnormal uterine bleeding.  3. History of anemia.   PAST SURGICAL HISTORY:  1. Laparoscopy, hysteroscopy, and D&C in 2003.  2. Bilateral tubal ligation 1999.  3. Tonsillectomy.  4. Jaw surgery.  5. Knee surgery.   CURRENT MEDICATIONS:  1. Tylenol Sinus.  2. Iron supplementation.   OB HISTORY:  Normal spontaneous vaginal delivery x3 at term.   ALLERGIES:  PENICILLIN, AMPICILLIN, CIPROFLOXACIN, SULFA, and CODEINE.   ADMITTING DIAGNOSES:  1. Abnormal uterine bleeding.  2. History of endometrial hyperplasia without atypia.  3. Anemia.   HOSPITAL COURSE:  The patient was admitted on same day of surgery, July 12, 2002 where she underwent a total vaginal hysterectomy.  Her surgery went  well.  Blood loss was 450 cc, however.  Her pathology showed only benign  menstrual type endometrium.  Ovarian conservation was undertaken at her  request.   The patient's postoperative course was uneventful.  Her hemoglobin did drop  to  7.5.  She was asymptomatic with this degree of anemia.  Her preoperative  hemoglobin was 9.9.   The patient had a normal postoperative course and quickly resumed normal  bowel and bladder function.  She was ambulating and met all expected goals  in the postoperative period.  She was routinely discharged postoperative day  number two in stable condition.   DISCHARGE DIAGNOSES:  1. Stable status post total vaginal hysterectomy.  2. Anemia, asymptomatic.   PLAN:  1. Home.  2. Nu-Iron 150 one p.o. q.d. x5 months.  3. Z-PAK given for sinusitis.  4. Percocet already given for pain management.  5. Routine postoperative care reviewed.  6. Follow up in the office in one to two weeks for a recheck, a hemoglobin     check, and a routine postoperative evaluation.  Tracie Harrier, M.D.    REG/MEDQ  D:  08/24/2002  T:  08/24/2002  Job:  [id number]

## 2011-02-07 NOTE — Op Note (Signed)
NAME:  [name], [name]                          ACCOUNT NO.:  [id number]   MEDICAL RECORD NO.:  [id number]                   PATIENT TYPE:  INP   LOCATION:  9399                                 FACILITY:  [facility]   PHYSICIAN:  Tracie Harrier, M.D.              DATE OF BIRTH:  September 30, 1960   DATE OF PROCEDURE:  07/12/2002  DATE OF DISCHARGE:                                 OPERATIVE REPORT   PREOPERATIVE DIAGNOSES:  1. Abnormal uterine bleeding.  2. History of anemia.  3. History of endometrial hyperplasia without atypia.   POSTOPERATIVE DIAGNOSES:  1. Abnormal uterine bleeding.  2. History of anemia.  3. History of endometrial hyperplasia without atypia.   PROCEDURE:  Total vaginal hysterectomy.   SURGEON:  Tracie Harrier, M.D.   ASSISTANT:  Duke Salvia. Marcelle Overlie, M.D.   ANESTHESIA:  General.   ESTIMATED BLOOD LOSS:  450 cc.   COMPLICATIONS:  None.   FINDINGS:  At time of surgery a normal sized uterus was encountered.  The  cul-de-sac was somewhat obliterated consistent with endometriosis.  The  ovaries were visualized and noted to be normal.  Both Filshie clips were  seen and the left Filshie clip was removed.  Therefore, the right Filshie  clip remains in vivo.   PROCEDURE:  The patient was taken to the operating room where a general  endotracheal anesthetic was administered.  The patient was placed on the  operating table in the dorsal lithotomy position.  The perineum and vagina  were prepped and draped in the usual sterile fashion with Betadine and  sterile drapes.  A weighted speculum was placed in the posterior vagina.  The cervix was grasped with a Christella Hartigan' tenaculum.  The posterior cul-de-sac  was sharply and atraumatically entered.  The uterosacral ligaments were  clamped with the LigaSure device and cauterized thoroughly x2.  The vascular  pedicle was then sharply created with the Mayo scissors.  A circumferential  incision was made around the anterior portion of  the cervix and the bladder  was dissected sharply and bluntly cephalad.  A bladder blade was placed  behind the bladder to insure its protection during the procedure.  The  anterior cul-de-sac was eventually atraumatically entered and a Deaver was  placed behind this to insure its protection during the procedure.  Successive bites were then carried up the body of the uterus using the  LigaSure device.  All vascular pedicles were cauterized thoroughly with the  LigaSure device and sharply created with the Mayo scissors.  This was  carried out cephalad hugging very close to the uterine body with successive  meticulous bites.  Next, after the uterine arteries were ligated and  cauterized, the uterine fundus was delivered posteriorly by grasping this  with a thyroid tenaculum.  The uterine ovarian ligaments were then grasped  with the LigaSure device and divided sharply.  The ovaries were visualized  and noted to be normal.  The uterine ovarian ligaments were hemostatic.  A  bleeder got loose in the right pelvic side wall and we did have some  excessive blood loss from this.  This was eventually grasped and suture  ligated with a 0 Vicryl transfixing suture.  After hemostasis was achieved  the vaginal cuff was closed in a vertical fashion.  This was done with  multiple interrupted sutures of 0 Vicryl suture.  The cuff was  reapproximated nicely.  A Foley was then inserted with clear copious urine  obtained.  There were no perioperative complications.  The left Filshie clip  was removed but the right was not due to its high position in the pelvis.  Good hemostasis was noted.  The Foley inserted.   Final sponge, needle, and instrument count was correct x3.  The patient was  awakened, extubated, and taken to the recovery room in good condition.  The  surgery went well.  However, we experienced a little excessive blood loss  with the loss of this pedicle on the right side.  There were no further   complications.                                               Tracie Harrier, M.D.    REG/MEDQ  D:  07/12/2002  T:  07/12/2002  Job:  [id number]

## 2011-02-07 NOTE — Op Note (Signed)
NAMECHELLY, DOMBECK                ACCOUNT NO.:  [id number]   MEDICAL RECORD NO.:  [id number]          PATIENT TYPE:  INP   LOCATION:  1825                         FACILITY:  [facility]   PHYSICIAN:  Wilmon Arms. Corliss Skains, M.D. DATE OF BIRTH:  03-05-1961   DATE OF PROCEDURE:  07/16/2005  DATE OF DISCHARGE:                                 OPERATIVE REPORT   PREOPERATIVE DIAGNOSIS:  Acute cholecystitis.   POSTOPERATIVE DIAGNOSIS:  Acute calculus cholecystitis.   PROCEDURE PERFORMED:  Laparoscopic cholecystectomy with intraoperative  cholangiogram.   SURGEON:  Wilmon Arms. Tsuei, M.D.   ANESTHESIA:  General endotracheal anesthesia.   INDICATIONS FOR PROCEDURE:  The patient is a 50 year old female with a past  medical history of non-Hodgkin's lymphoma who recently underwent  chemotherapy.  She presents with half a day of severe right upper quadrant  epigastric pain associated with nausea but no vomiting.  She had a normal  bowel movement yesterday.  She was evaluated in the emergency department  where she was found to have an ultrasound showing multiple gallstones with  no acute evidence of cholecystitis.  A CT scan was also performed which also  showed persistent retroperitoneal lymphadenopathy related to her Hodgkin's  disease.  The gallstones were noted but there was no evidence of  cholecystitis.  However, on physical examination, the patient was  exquisitely tender on her right upper quadrant.  The ultrasound also had a  positive sonographic Murphy's sign.  Liver function tests were normal.  The  decision was made to proceed with laparoscopic cholecystectomy.  I explained  the risks and benefits of the procedure to the patient and she wishes to  proceed.   DESCRIPTION OF PROCEDURE:  Patient was brought to the operating room and  placed in the supine position on the operating table.  After an adequate  level of general endotracheal anesthesia was obtained, the patient's abdomen  was  prepped with Betadine and draped in sterile fashion.  A timeout was  taken to insure the proper patient and proper procedure.  A curvilinear  incision was made below her umbilicus.  Dissection was carried down through  subcutaneous tissues with blunt dissection.  The fascia was divided  vertically in the midline.  The fascia was grasped with Kocher clamps and  elevated.  The peritoneum was entered.  A stay suture of 0 Vicryl was placed  in a pursestring fashion.  Hasson cannula was then inserted and secured with  a stay suture.  Pneumoperitoneum was obtained by insufflating CO2  maintaining a maximum pressure of 15 mmHg.  The patient was positioned in  reverse Trendelenburg position, tilted slightly to her left.  A 10 mm port  was placed in the subxiphoid position.  Two 5 mm ports were placed in the  right upper quadrant.  The gallbladder was visualized.  The wall was  moderately thickened but there were no dense adhesions to the surface of the  gallbladder.  It was grasped with a clamp and elevated over the edge of the  liver.  The peritoneum around the hilum of the gallbladder was opened.  The  cystic duct was circumferentially dissected, ligated with a clip distally.  A small opening was created on the surface of the cystic duct.  A Cook  cholangiogram catheter was then inserted through a separate stab incision  and then inserted into the cystic duct.  It was secured with a clip.  A  cholangiogram was then obtained using fluoroscopy which showed good filling  of the distal and proximal biliary tree with no evidence of filling defects.  There was good flow to the duodenum.  The cholangiogram catheter was then  removed and the cystic duct was ligated with clips and divided.  Two  branches of the cystic artery were also ligated with clips and divided.  Cautery was then used to remove the gallbladder from the liver bed.  This  was fairly difficulty due to the edema and inflammation around  the  gallbladder.  Once the gallbladder was removed, it was placed in an  EndoCatch sac and pulled up through the umbilical port.  The fascia at the  umbilical port had to be enlarged slightly to allow the removal of the  gallbladder due to the presence of a very large stone.  Once the gallbladder  was removed, the ports were reinserted.  The liver bed was thoroughly  irrigated.  A couple of bleeding areas in the gallbladder bed were  cauterized.  Once hemostasis was felt to be adequate, the remainder of the  abdomen was visually inspected.  No other abnormalities were noted.  The  ports were then removed under direct vision and the pneumoperitoneum was  released.  The pursestring suture was used to close the umbilical port site.  A 4-0 Monocryl was used in subcuticular fashion to close the skin incisions.  All the ports had previously been infiltrated with 0.25% Marcaine.  Steri-  Strips and clean dressings were applied.  The patient was extubated and  brought to the recovery room in stable condition.      Wilmon Arms. Tsuei, M.D.  Electronically Signed     MKT/MEDQ  D:  07/16/2005  T:  07/17/2005  Job:  [id number]

## 2011-06-17 LAB — POCT I-STAT, CHEM 8
BUN: 7
Calcium, Ion: 1.03 — ABNORMAL LOW
Chloride: 105
Creatinine, Ser: 0.7
Glucose, Bld: 129 — ABNORMAL HIGH
HCT: 40
Hemoglobin: 13.6
Potassium: 3.3 — ABNORMAL LOW
Sodium: 138
TCO2: 20

## 2011-06-17 LAB — CBC
HCT: 39.5
MCV: 87.9
Platelets: 144 — ABNORMAL LOW
RBC: 4.5
WBC: 6.2

## 2011-06-17 LAB — DIFFERENTIAL
Eosinophils Absolute: 0.1
Eosinophils Relative: 1
Lymphocytes Relative: 14
Lymphs Abs: 0.8
Monocytes Relative: 8

## 2011-06-17 LAB — POCT CARDIAC MARKERS
CKMB, poc: <1 — ABNORMAL LOW
Myoglobin, poc: 58.6
Operator id: 294501
Troponin i, poc: <0.05

## 2011-06-20 LAB — CARDIAC PANEL(CRET KIN+CKTOT+MB+TROPI)
CK, MB: 1.5
CK, MB: 1.6
CK, MB: 1.6
Relative Index: INVALID
Relative Index: INVALID
Relative Index: INVALID
Total CK: 47
Total CK: 50
Total CK: 53
Troponin I: 0.01
Troponin I: <0.01
Troponin I: <0.01

## 2011-06-20 LAB — BASIC METABOLIC PANEL
BUN: 15
BUN: 14
CO2: 31
CO2: 26
Calcium: 9.4
Calcium: 9.7
Chloride: 104
Creatinine, Ser: 0.85
Creatinine, Ser: 0.8
GFR calc Af Amer: >60
GFR calc non Af Amer: >60
Glucose, Bld: 114 — ABNORMAL HIGH
Glucose, Bld: 142 — ABNORMAL HIGH
Potassium: 3.9
Sodium: 141

## 2011-06-20 LAB — DIFFERENTIAL
Basophils Absolute: 0
Basophils Absolute: 0
Basophils Relative: 0
Basophils Relative: 0
Eosinophils Absolute: 0.1
Eosinophils Absolute: 0.1
Eosinophils Relative: 2
Lymphocytes Relative: 31
Lymphs Abs: 1.3
Monocytes Absolute: 0.4
Monocytes Relative: 9
Neutro Abs: 2.4
Neutro Abs: 3.4
Neutrophils Relative %: 58
Neutrophils Relative %: 64

## 2011-06-20 LAB — CBC
HCT: 37.7
Hemoglobin: 13.4
MCHC: 35.6
MCHC: 35.3
MCV: 88.2
MCV: 86.9
Platelets: 142 — ABNORMAL LOW
RBC: 4.27
RDW: 13
RDW: 12.2
WBC: 4.2

## 2011-06-20 LAB — GLUCOSE, CAPILLARY
Glucose-Capillary: 103 — ABNORMAL HIGH
Glucose-Capillary: 110 — ABNORMAL HIGH
Glucose-Capillary: 122 — ABNORMAL HIGH
Glucose-Capillary: 140 — ABNORMAL HIGH

## 2011-06-20 LAB — LIPID PANEL
Cholesterol: 170
HDL: 56
LDL Cholesterol: 96
Total CHOL/HDL Ratio: 3
Triglycerides: 88
VLDL: 18

## 2011-06-20 LAB — HEMOGLOBIN A1C
Hgb A1c MFr Bld: 6.3 — ABNORMAL HIGH
Mean Plasma Glucose: 147

## 2011-06-20 LAB — PROTIME-INR
INR: 1
Prothrombin Time: 13

## 2011-06-20 LAB — POCT CARDIAC MARKERS
Myoglobin, poc: 41.5
Operator id: 5506

## 2011-06-20 LAB — TSH: TSH: 1.059

## 2011-07-04 LAB — I-STAT 8, (EC8 V) (CONVERTED LAB)
BUN: 9
Bicarbonate: 23.6
Chloride: 109
Glucose, Bld: 106 — ABNORMAL HIGH
HCT: 35 — ABNORMAL LOW
Operator id: 277751
pCO2, Ven: 43.8 — ABNORMAL LOW
pH, Ven: 7.339 — ABNORMAL HIGH

## 2011-07-04 LAB — POCT CARDIAC MARKERS
CKMB, poc: <1 — ABNORMAL LOW
CKMB, poc: <1 — ABNORMAL LOW
Myoglobin, poc: 37.8
Myoglobin, poc: 40
Myoglobin, poc: 42.7
Operator id: 270651
Operator id: 270651
Troponin i, poc: <0.05
Troponin i, poc: <0.05

## 2011-07-04 LAB — D-DIMER, QUANTITATIVE: D-Dimer, Quant: <0.22

## 2011-07-07 LAB — I-STAT 8, (EC8 V) (CONVERTED LAB)
Acid-Base Excess: 4 — ABNORMAL HIGH
BUN: 13
Bicarbonate: 29.4 — ABNORMAL HIGH
Chloride: 104
Glucose, Bld: 125 — ABNORMAL HIGH
HCT: 40
Hemoglobin: 13.6
Operator id: 272551
Potassium: 3.5
Sodium: 139
TCO2: 31
pCO2, Ven: 48.2
pH, Ven: 7.394 — ABNORMAL HIGH

## 2011-07-07 LAB — URINALYSIS, ROUTINE W REFLEX MICROSCOPIC
Bilirubin Urine: NEGATIVE
Glucose, UA: NEGATIVE
Ketones, ur: 15 — AB
Nitrite: NEGATIVE
Protein, ur: NEGATIVE
Specific Gravity, Urine: 1.033 — ABNORMAL HIGH
Urobilinogen, UA: 1
pH: 6

## 2011-07-07 LAB — DIFFERENTIAL
Basophils Absolute: 0
Basophils Relative: 0
Eosinophils Absolute: 0.1
Eosinophils Relative: 2
Lymphocytes Relative: 27
Lymphs Abs: 0.9
Monocytes Absolute: 0.4
Monocytes Relative: 10
Neutro Abs: 2.1
Neutrophils Relative %: 60

## 2011-07-07 LAB — D-DIMER, QUANTITATIVE: D-Dimer, Quant: <0.22

## 2011-07-07 LAB — COMPREHENSIVE METABOLIC PANEL
AST: 78 — ABNORMAL HIGH
Albumin: 4.3
Alkaline Phosphatase: 94
BUN: 7
CO2: 32
Chloride: 100
Creatinine, Ser: 0.87
GFR calc non Af Amer: >60
Potassium: 3.9
Total Bilirubin: 0.4

## 2011-07-07 LAB — CK TOTAL AND CKMB (NOT AT ARMC)
CK, MB: 0.8
CK, MB: 0.9
CK, MB: 1.1
Relative Index: INVALID
Relative Index: INVALID
Relative Index: INVALID
Total CK: 35
Total CK: 40
Total CK: 40

## 2011-07-07 LAB — CBC
HCT: 37.5
HCT: 39.1
Hemoglobin: 13
MCHC: 34.7
MCV: 88.5
MCV: 89.6
Platelets: 163
RBC: 4.24
RBC: 4.37
RDW: 12.8
WBC: 3.5 — ABNORMAL LOW
WBC: 4.2

## 2011-07-07 LAB — LIPID PANEL
Cholesterol: 234 — ABNORMAL HIGH
HDL: 47
LDL Cholesterol: 150 — ABNORMAL HIGH
Total CHOL/HDL Ratio: 5
Triglycerides: 183 — ABNORMAL HIGH
VLDL: 37

## 2011-07-07 LAB — URINE MICROSCOPIC-ADD ON

## 2011-07-07 LAB — POCT CARDIAC MARKERS
CKMB, poc: <1 — ABNORMAL LOW
CKMB, poc: <1 — ABNORMAL LOW
Myoglobin, poc: 42.2
Myoglobin, poc: 46.6
Operator id: 272551
Operator id: 277751
Troponin i, poc: <0.05
Troponin i, poc: <0.05

## 2011-07-07 LAB — URINE CULTURE: Colony Count: >=100000

## 2011-07-07 LAB — TROPONIN I
Troponin I: 0.01
Troponin I: 0.01

## 2011-07-07 LAB — APTT: aPTT: 29

## 2011-07-07 LAB — PROTIME-INR
INR: 0.9
Prothrombin Time: 12.8

## 2011-07-07 LAB — POCT PREGNANCY, URINE
Operator id: 272551
Preg Test, Ur: NEGATIVE

## 2011-07-07 LAB — TSH: TSH: 1.796

## 2011-07-07 LAB — POCT I-STAT CREATININE
Creatinine, Ser: 0.7
Operator id: 272551

## 2011-07-07 LAB — HEMOGLOBIN A1C
Hgb A1c MFr Bld: 5.8
Mean Plasma Glucose: 129

## 2011-07-07 LAB — TROPONIN T: Troponin T TROPT: <0.01 ng/mL (ref <0.01)

## 2011-07-07 LAB — VALPROIC ACID LEVEL: Valproic Acid Lvl: 57.3

## 2011-07-07 LAB — B-NATRIURETIC PEPTIDE (CONVERTED LAB): Pro B Natriuretic peptide (BNP): <30

## 2011-11-08 ENCOUNTER — Emergency Department (HOSPITAL_BASED_OUTPATIENT_CLINIC_OR_DEPARTMENT_OTHER)
Admission: EM | Admit: 2011-11-08 | Discharge: 2011-11-08 | Disposition: A | Discharge status: Home / Self Care | Payer: Federal, State, Local not specified - PPO | Attending: Emergency Medicine | Admitting: Emergency Medicine

## 2011-11-08 ENCOUNTER — Encounter (HOSPITAL_BASED_OUTPATIENT_CLINIC_OR_DEPARTMENT_OTHER): Payer: Self-pay | Admitting: *Deleted

## 2011-11-08 DIAGNOSIS — G43409 Hemiplegic migraine, not intractable, without status migrainosus: Secondary | ICD-10-CM | POA: Insufficient documentation

## 2011-11-08 DIAGNOSIS — B749 Filariasis, unspecified: Secondary | ICD-10-CM | POA: Insufficient documentation

## 2011-11-08 HISTORY — DX: Cerebral infarction, unspecified: I63.9

## 2011-11-08 HISTORY — DX: Malignant (primary) neoplasm, unspecified: C80.1

## 2011-11-08 HISTORY — DX: Non-Hodgkin lymphoma, unspecified, unspecified site: C85.90

## 2011-11-08 MED ORDER — DIPHENHYDRAMINE HCL 50 MG/ML IJ SOLN
25.0000 mg | Freq: Once | INTRAMUSCULAR | Status: AC
  Administered 2011-11-08: 25 mg via INTRAVENOUS
  Filled 2011-11-08: qty 1

## 2011-11-08 MED ORDER — METOCLOPRAMIDE HCL 5 MG/ML IJ SOLN
10.0000 mg | Freq: Once | INTRAMUSCULAR | Status: AC
  Administered 2011-11-08: 10 mg via INTRAVENOUS
  Filled 2011-11-08: qty 2

## 2011-11-08 MED ORDER — SODIUM CHLORIDE 0.9 % IV BOLUS (SEPSIS)
1000.0000 mL | Freq: Once | INTRAVENOUS | Status: AC
  Administered 2011-11-08: 1000 mL via INTRAVENOUS

## 2011-11-08 MED ORDER — HYDROMORPHONE HCL PF 1 MG/ML IJ SOLN
1.0000 mg | Freq: Once | INTRAMUSCULAR | Status: AC
  Administered 2011-11-08: 1 mg via INTRAVENOUS
  Filled 2011-11-08: qty 1

## 2011-11-08 NOTE — Discharge Instructions (Signed)
Recurrent Migraine Headache You have a recurrent migraine headache. The caregiver can usually provide good relief for this headache. If this headache is the same as your previous migraine headaches, it is safe to treat you without repeating a complete evaluation.  These headaches usually have at least two of the following problems:   They occur on one side of the head, pulsate, and are severe enough to prevent daily activities.   They are aggravated by daily physical activities.  You may have one or more of the following symptoms:   Nausea (feeling sick to your stomach).   Vomiting.   Pain with exposure to bright lights or loud noises.  Most headache sufferers have a family history of migraines. Your headaches may also be related to alcohol and smoking habits. Too much sleep, too little sleep, mood, and anxiety may also play a part. Changing some of these triggers may help you lower the number and level of pain of the headaches. Headaches may be related to menses (female menstruation). There are numerous medications that can prevent these headaches. Your caregiver can help you with a medication or regimen (procedure to follow). If this has been a chronic (long-term) condition, the use of long-term narcotics is not recommended. Using long-term narcotics can cause recurrent migraines. Narcotics are only a temporary measure only. They are used for the infrequent migraine that fails to respond to all other measures. SEEK MEDICAL CARE IF:   You do not get relief from the medications given to you.   You have a recurrence of pain.   This headache begins to differ from past migraine (for example if it is more severe).  SEEK IMMEDIATE MEDICAL CARE IF:  You have a fever.   You have a stiff neck.   You have vision loss or have changes in vision.   You have problems with feeling lightheaded, become faint, or lose your balance.   You have muscular weakness.   You have loss of muscular control.     You develop severe symptoms different from your first symptoms.   You start losing your balance or have trouble walking.   You feel faint or pass out.  MAKE SURE YOU:   Understand these instructions.   Will watch your condition.   Will get help right away if you are not doing well or get worse.  Document Released: 06/03/2001 Document Revised: 05/21/2011 Document Reviewed: 04/27/2008 [employer] Patient Information 2012 [employer]. 

## 2011-11-08 NOTE — ED Provider Notes (Signed)
History     CSN: [id number]  Arrival date & time 11/08/11  0129   First MD Initiated Contact with Patient 11/08/11 [phone]      Chief Complaint  Patient presents with  . Migraine    (Consider location/radiation/quality/duration/timing/severity/associated sxs/prior treatment) HPI Level 5 Caveat: Severe pain preventing patient from conversing. History primarily obtained from husband. This is a 51 year old white female with a long-standing history of hemiplegic migraines. Her spouse states she called him about 12:30 this morning stating she was having a severe right-sided migraine headache and felt dizzy and confused. He states she was hemiplegic on the left side but the hemiplegia has subsequently resolved. She has not had nausea or vomiting but does have photophobia. She has recently been placed on disability due to her migraines.  Past Medical History  Diagnosis Date  . Migraine   . Non Hodgkin's lymphoma   . Stroke   . Cancer     Past Surgical History  Procedure Date  . Occipital nerve stimulator insertion     No family history on file.  History  Substance Use Topics  . Smoking status: Never Smoker   . Smokeless tobacco: Not on file  . Alcohol Use: No    OB History    Grav Para Term Preterm Abortions TAB SAB Ect Mult Living                  Review of Systems  All other systems reviewed and are negative.    Allergies  Amoxicillin; Ciprofloxacin; Codeine; Sulfa antibiotics; and Zomig  Home Medications   Current Outpatient Rx  Name Route Sig Dispense Refill  . CALCIUM-VITAMIN D 500-200 MG-UNIT PO TABS Oral Take 1 tablet by mouth 2 (two) times daily with a meal.    . DOCUSATE SODIUM 100 MG PO CAPS Oral Take 100 mg by mouth 2 (two) times daily as needed.    Marland Kitchen HYDROXYZINE HCL 25 MG PO TABS Oral Take 25 mg by mouth 3 (three) times daily as needed.    Marland Kitchen OMEPRAZOLE 20 MG PO CPDR Oral Take 40 mg by mouth daily.    . SERTRALINE HCL 50 MG PO TABS Oral Take 25 mg by  mouth daily.    . TOPIRAMATE 25 MG PO CPSP Oral Take 25 mg by mouth daily.      BP 136/79  Pulse 67  Temp(Src) 97.5 F (36.4 C) (Oral)  Resp 12  SpO2 100%  Physical Exam General: Well-developed, well-nourished female in no acute distress; appearance consistent with age of record HENT: normocephalic, atraumatic Eyes: pupils equal round and reactive to light; extraocular muscle function cannot be assessed this patient closes her eyes were rolled her eyes back on attempts to examine him. Neck: supple Heart: regular rate and rhythm Lungs: clear to auscultation bilaterally Abdomen: soft; nondistended Extremities: No deformity; full range of motion; pulses normal Neurologic: motor function intact in all extremities and symmetric with either generalized weakness or poor effort; no facial droop Skin: Warm and dry    ED Course  Procedures (including critical care time)     MDM  4:04 AM Feels better. Ready to go home.         Hanley Seamen, MD 11/08/11 [phone]

## 2011-11-08 NOTE — ED Notes (Signed)
Called ems for a migraine that started about an hour ago. ems states when they arrived patient was on the floor not responding to questions. Patient states that she is "dizzy and confused", but she is answering questions appropriately. She states that she's never had these symptoms with a migraine before. Patient states that she took topamax prior to calling ems.

## 2012-03-16 ENCOUNTER — Emergency Department (HOSPITAL_BASED_OUTPATIENT_CLINIC_OR_DEPARTMENT_OTHER)
Admission: EM | Admit: 2012-03-16 | Discharge: 2012-03-16 | Disposition: A | Discharge status: Home Health | Payer: Federal, State, Local not specified - PPO | Attending: Emergency Medicine | Admitting: Emergency Medicine

## 2012-03-16 ENCOUNTER — Encounter (HOSPITAL_BASED_OUTPATIENT_CLINIC_OR_DEPARTMENT_OTHER): Payer: Self-pay

## 2012-03-16 ENCOUNTER — Emergency Department (HOSPITAL_BASED_OUTPATIENT_CLINIC_OR_DEPARTMENT_OTHER): Payer: Federal, State, Local not specified - PPO

## 2012-03-16 DIAGNOSIS — R300 Dysuria: Secondary | ICD-10-CM | POA: Insufficient documentation

## 2012-03-16 DIAGNOSIS — Z859 Personal history of malignant neoplasm, unspecified: Secondary | ICD-10-CM | POA: Insufficient documentation

## 2012-03-16 DIAGNOSIS — Z87898 Personal history of other specified conditions: Secondary | ICD-10-CM | POA: Insufficient documentation

## 2012-03-16 DIAGNOSIS — G43909 Migraine, unspecified, not intractable, without status migrainosus: Secondary | ICD-10-CM

## 2012-03-16 DIAGNOSIS — Z8673 Personal history of transient ischemic attack (TIA), and cerebral infarction without residual deficits: Secondary | ICD-10-CM | POA: Insufficient documentation

## 2012-03-16 LAB — DIFFERENTIAL
Basophils Absolute: 0 10*3/uL (ref 0.0–0.1)
Basophils Relative: 0 % (ref 0–1)
Neutro Abs: 2.7 10*3/uL (ref 1.7–7.7)
Neutrophils Relative %: 57 % (ref 43–77)

## 2012-03-16 LAB — BASIC METABOLIC PANEL
Chloride: 104 mEq/L (ref 96–112)
Creatinine, Ser: 0.7 mg/dL (ref 0.50–1.10)
GFR calc Af Amer: >90 mL/min (ref >90)
Potassium: 3.3 mEq/L — ABNORMAL LOW (ref 3.5–5.1)

## 2012-03-16 LAB — CBC
MCHC: 35.3 g/dL (ref 30.0–36.0)
Platelets: 165 10*3/uL (ref 150–400)
RDW: 12.6 % (ref 11.5–15.5)

## 2012-03-16 MED ORDER — DIPHENHYDRAMINE HCL 50 MG/ML IJ SOLN
25.0000 mg | Freq: Once | INTRAMUSCULAR | Status: AC
  Administered 2012-03-16: 25 mg via INTRAVENOUS
  Filled 2012-03-16: qty 1

## 2012-03-16 MED ORDER — DEXAMETHASONE SODIUM PHOSPHATE 10 MG/ML IJ SOLN
10.0000 mg | Freq: Once | INTRAMUSCULAR | Status: AC
  Administered 2012-03-16: 10 mg via INTRAVENOUS
  Filled 2012-03-16: qty 1

## 2012-03-16 MED ORDER — SODIUM CHLORIDE 0.9 % IV SOLN
Freq: Once | INTRAVENOUS | Status: AC
  Administered 2012-03-16: 1000 mL via INTRAVENOUS

## 2012-03-16 MED ORDER — HYDROMORPHONE HCL PF 1 MG/ML IJ SOLN
1.0000 mg | Freq: Once | INTRAMUSCULAR | Status: AC
  Administered 2012-03-16: 1 mg via INTRAVENOUS
  Filled 2012-03-16: qty 1

## 2012-03-16 MED ORDER — ONDANSETRON HCL 4 MG/2ML IJ SOLN
4.0000 mg | Freq: Once | INTRAMUSCULAR | Status: AC
  Administered 2012-03-16: 4 mg via INTRAVENOUS
  Filled 2012-03-16: qty 2

## 2012-03-16 MED ORDER — METOCLOPRAMIDE HCL 5 MG/ML IJ SOLN
10.0000 mg | Freq: Once | INTRAMUSCULAR | Status: AC
  Administered 2012-03-16: 10 mg via INTRAVENOUS
  Filled 2012-03-16: qty 2

## 2012-03-16 NOTE — ED Provider Notes (Addendum)
History     CSN: [id number]  Arrival date & time 03/16/12  1502   First MD Initiated Contact with Patient 03/16/12 1605      Chief Complaint  Patient presents with  . Migraine    (Consider location/radiation/quality/duration/timing/severity/associated sxs/prior treatment) Patient is a 51 y.o. Petersen presenting with migraine. The history is provided by the patient.  Migraine This is a recurrent problem. Episode onset: 3 weeks ago. The problem occurs constantly. The problem has been gradually worsening. Associated symptoms include headaches. Associated symptoms comments: Now having visual disturbance in the left eye. For the last 3 days something that looks like the letter C is going across her visual field.  Pain is 8/10 and is throbbing in nature.    Also the headache is different as it usually is on the right but this one is on the left. Nothing aggravates the symptoms. Nothing relieves the symptoms. Treatments tried: topamax. The treatment provided no relief.    Past Medical History  Diagnosis Date  . Migraine   . Non Hodgkin's lymphoma   . Stroke   . Cancer     Past Surgical History  Procedure Date  . Occipital nerve stimulator insertion   . Abdominal hysterectomy     No family history on file.  History  Substance Use Topics  . Smoking status: Never Smoker   . Smokeless tobacco: Not on file  . Alcohol Use: No    OB History    Grav Para Term Preterm Abortions TAB SAB Ect Mult Living                  Review of Systems  Genitourinary: Positive for dysuria.       Diagnosed with UTI and started antibiotic yesterday  Neurological: Positive for headaches. Negative for seizures, speech difficulty, weakness and numbness.  Psychiatric/Behavioral: Negative for confusion.  All other systems reviewed and are negative.    Allergies  Amoxicillin; Ciprofloxacin; Codeine; Morphine and related; Sulfa antibiotics; Zomig; and Eggs or egg-derived products  Home Medications     Current Outpatient Rx  Name Route Sig Dispense Refill  . CALCIUM-VITAMIN D 500-200 MG-UNIT PO TABS Oral Take 1 tablet by mouth 2 (two) times daily with a meal.    . DOCUSATE SODIUM 100 MG PO CAPS Oral Take 100 mg by mouth 2 (two) times daily as needed.    Marland Kitchen HYDROXYZINE HCL 25 MG PO TABS Oral Take 25 mg by mouth 3 (three) times daily as needed.    Marland Kitchen LISINOPRIL 10 MG PO TABS Oral Take 5 mg by mouth daily.    Marland Kitchen MELATONIN 5 MG PO TABS Oral Take by mouth.    Marland Kitchen NITROFURANTOIN MACROCRYSTAL 100 MG PO CAPS Oral Take 100 mg by mouth 4 (four) times daily.    Marland Kitchen OMEPRAZOLE 20 MG PO CPDR Oral Take 40 mg by mouth daily.    . SERTRALINE HCL 50 MG PO TABS Oral Take 25 mg by mouth daily.    . TOPIRAMATE 25 MG PO CPSP Oral Take 25 mg by mouth daily.      BP 113/62  Pulse 71  Temp 98.9 F (37.2 C) (Oral)  Resp 16  Ht 5\' 8"  (1.727 m)  Wt 169 lb (76.658 kg)  BMI 25.Haley kg/m2  SpO2 97%  Physical Exam  Nursing note and vitals reviewed. Constitutional: She is oriented to person, place, and time. She appears well-developed and well-nourished. No distress.  HENT:  Head: Normocephalic and atraumatic.  Eyes: EOM are  normal. Pupils are equal, round, and reactive to light.  Neck: Normal range of motion. Neck supple.  Cardiovascular: Normal rate, regular rhythm, normal heart sounds and intact distal pulses.  Exam reveals no friction rub.   No murmur heard. Pulmonary/Chest: Effort normal and breath sounds normal. She has no wheezes. She has no rales.  Abdominal: Soft. Bowel sounds are normal. She exhibits no distension. There is no tenderness. There is no rebound and no guarding.  Musculoskeletal: Normal range of motion. She exhibits no tenderness.       No edema  Lymphadenopathy:    She has no cervical adenopathy.  Neurological: She is alert and oriented to person, place, and time. She has normal strength. No cranial nerve deficit or sensory deficit. Coordination and gait normal.       Normal visual fields  without cuts.  photophobia  Skin: Skin is warm and dry. No rash noted.  Psychiatric: She has a normal mood and affect. Her behavior is normal.    ED Course  Procedures (including critical care time)  Labs Reviewed  BASIC METABOLIC PANEL - Abnormal; Notable for the following:    Potassium 3.3 (*)     Glucose, Bld 103 (*)     All other components within normal limits  CBC  DIFFERENTIAL   Ct Head Wo Contrast  03/16/2012  *RADIOLOGY REPORT*  Clinical Data: Left sided headache.  Nausea.  Stroke.  CT HEAD WITHOUT CONTRAST  Technique:  Contiguous axial images were obtained from the base of the skull through the vertex without contrast.  Comparison: 02/27/2009  Findings: Occipital stimulator introduce is streak artifact which partially obscures the cerebellum.  The visualized portions of the posterior fossa structures, cerebral peduncles, thalami, basal ganglia, basilar cisterns, and ventricular system appear intact.  Bifrontal atrophy noted. Pineal calcifications appear stable.  No intracranial hemorrhage, mass lesion, or acute infarction is identified.  IMPRESSION:  1.  No acute intracranial findings. 2.  Bifrontal atrophy. 3.  Occipital stimulator noted.  Original Report Authenticated By: Dellia Cloud, M.D.     1. Migraine       MDM   Pt with symptoms more typical for a  migraine HA without sx suggestive of SAH(sudden onset, worst of life, or deficits) or infection, however patient has a long history of migraines even has I nerve stimulator for them. However she states her migraines are always on the right and for the last 3 weeks she's had a migraine on the left and for the last 3 days she's been having visual disturbance that she describes as the letter 'c' going across her left visual field. Normal neuro exam and vital signs.  However due to the change in nature of her headache concern for possible aneurysm versus cavernous venous thrombosis however patient is not a candidate for  MRI. She does not have symptoms suggestive of a bleed. Will give HA cocktail and CT and re-eval.  5:06 PM Labs and CT are unremarkable. n No improvement in the pain and pt was given dilaudid.      Gwyneth Sprout, MD 03/16/12 1706  Gwyneth Sprout, MD 03/16/12 1744  Gwyneth Sprout, MD 03/16/12 1745

## 2012-03-16 NOTE — ED Notes (Signed)
Pt c/o migraine for past 3 weeks.  [facility] states [facility] and was told to come to [facility] for evaluation.  Pt states she has been nauseated, denies emesis.

## 2014-07-05 ENCOUNTER — Emergency Department (HOSPITAL_BASED_OUTPATIENT_CLINIC_OR_DEPARTMENT_OTHER)
Admission: EM | Admit: 2014-07-05 | Discharge: 2014-07-05 | Disposition: A | Discharge status: Home / Self Care | Payer: Federal, State, Local not specified - PPO | Attending: Emergency Medicine | Admitting: Emergency Medicine

## 2014-07-05 ENCOUNTER — Encounter (HOSPITAL_BASED_OUTPATIENT_CLINIC_OR_DEPARTMENT_OTHER): Payer: Self-pay | Admitting: Emergency Medicine

## 2014-07-05 ENCOUNTER — Emergency Department (HOSPITAL_BASED_OUTPATIENT_CLINIC_OR_DEPARTMENT_OTHER): Payer: Federal, State, Local not specified - PPO

## 2014-07-05 DIAGNOSIS — Z880 Allergy status to penicillin: Secondary | ICD-10-CM | POA: Diagnosis not present

## 2014-07-05 DIAGNOSIS — Z79899 Other long term (current) drug therapy: Secondary | ICD-10-CM | POA: Diagnosis not present

## 2014-07-05 DIAGNOSIS — G43909 Migraine, unspecified, not intractable, without status migrainosus: Secondary | ICD-10-CM | POA: Insufficient documentation

## 2014-07-05 DIAGNOSIS — R079 Chest pain, unspecified: Secondary | ICD-10-CM | POA: Diagnosis present

## 2014-07-05 DIAGNOSIS — Z8673 Personal history of transient ischemic attack (TIA), and cerebral infarction without residual deficits: Secondary | ICD-10-CM | POA: Insufficient documentation

## 2014-07-05 DIAGNOSIS — Z8572 Personal history of non-Hodgkin lymphomas: Secondary | ICD-10-CM | POA: Diagnosis not present

## 2014-07-05 DIAGNOSIS — Z859 Personal history of malignant neoplasm, unspecified: Secondary | ICD-10-CM | POA: Insufficient documentation

## 2014-07-05 LAB — COMPREHENSIVE METABOLIC PANEL
ALBUMIN: 4 g/dL (ref 3.5–5.2)
ALT: 50 U/L — ABNORMAL HIGH (ref 0–35)
ANION GAP: 15 (ref 5–15)
AST: 44 U/L — ABNORMAL HIGH (ref 0–37)
Alkaline Phosphatase: 94 U/L (ref 39–117)
BILIRUBIN TOTAL: 0.3 mg/dL (ref 0.3–1.2)
BUN: 17 mg/dL (ref 6–23)
CALCIUM: 9.4 mg/dL (ref 8.4–10.5)
CHLORIDE: 103 meq/L (ref 96–112)
CO2: 23 meq/L (ref 19–32)
CREATININE: 0.8 mg/dL (ref 0.50–1.10)
GFR calc Af Amer: >90 mL/min (ref >90)
GFR, EST NON AFRICAN AMERICAN: 83 mL/min — AB (ref >90)
Glucose, Bld: 311 mg/dL — ABNORMAL HIGH (ref 70–99)
Potassium: 3.6 mEq/L — ABNORMAL LOW (ref 3.7–5.3)
Sodium: 141 mEq/L (ref 137–147)
Total Protein: 6.7 g/dL (ref 6.0–8.3)

## 2014-07-05 LAB — CBC
HEMATOCRIT: 37.9 % (ref 36.0–46.0)
Hemoglobin: 12.9 g/dL (ref 12.0–15.0)
MCH: 28.4 pg (ref 26.0–34.0)
MCHC: 34 g/dL (ref 30.0–36.0)
MCV: 83.5 fL (ref 78.0–100.0)
PLATELETS: 138 10*3/uL — AB (ref 150–400)
RBC: 4.54 MIL/uL (ref 3.87–5.11)
RDW: 12.7 % (ref 11.5–15.5)
WBC: 5.5 10*3/uL (ref 4.0–10.5)

## 2014-07-05 LAB — TROPONIN I: Troponin I: <0.3 ng/mL (ref <0.30)

## 2014-07-05 MED ORDER — ONDANSETRON HCL 4 MG/2ML IJ SOLN
4.0000 mg | Freq: Once | INTRAMUSCULAR | Status: AC
  Administered 2014-07-05: 4 mg via INTRAVENOUS
  Filled 2014-07-05: qty 2

## 2014-07-05 MED ORDER — HYDROMORPHONE HCL 1 MG/ML IJ SOLN
1.0000 mg | INTRAMUSCULAR | Status: DC | PRN
  Administered 2014-07-05: 1 mg via INTRAVENOUS
  Filled 2014-07-05: qty 1

## 2014-07-05 MED ORDER — KETOROLAC TROMETHAMINE 30 MG/ML IJ SOLN: 30.0000 mg | Freq: Once | INTRAMUSCULAR | Status: DC

## 2014-07-05 MED ORDER — HYDROCODONE-ACETAMINOPHEN 5-325 MG PO TABS: 1.0000 | ORAL_TABLET | ORAL | Status: DC | PRN

## 2014-07-05 MED ORDER — HYDROMORPHONE HCL 1 MG/ML IJ SOLN: 1.0000 mg | INTRAMUSCULAR | Status: DC | PRN

## 2014-07-05 MED ORDER — KETOROLAC TROMETHAMINE 30 MG/ML IJ SOLN
30.0000 mg | Freq: Once | INTRAMUSCULAR | Status: AC
  Administered 2014-07-05: 30 mg via INTRAVENOUS
  Filled 2014-07-05: qty 1

## 2014-07-05 NOTE — Discharge Instructions (Signed)

## 2014-07-05 NOTE — ED Notes (Signed)
Pt c/o CP that started during "bronchoscope" today at [facility]-states she advised staff during procedure and was advised to come to ED of CP became worse

## 2014-07-05 NOTE — ED Provider Notes (Signed)
CSN: [id number]     Arrival date & time 07/05/14  1911 History  This chart was scribed for Tanna Furry, MD by Einar Pheasant, ED Scribe. This patient was seen in room MH09/MH09 and the patient's care was started at 8:03 PM.    Chief Complaint  Patient presents with  . Chest Pain   The history is provided by the patient. No language interpreter was used.   HPI Comments: [name] is a 53 y.o. female with hx of Non Hodgkin's lymphoma presents to the Emergency Department complaining of chest pain that started today during a bronchoscopy at [facility]. Pt states that during the bronchoscopy she was trying to tell them that she was experiencing a "crushing" chest pain. [name] states that the pain is worsened by deep breathing and coughing. She was told to report to the ED for evaluation. In 2006 she had an episode of bradycardia, which they thought she was going to need an angioplasty but all test came back normal. She has not had any similar episodes since then.   Past Medical History  Diagnosis Date  . Migraine   . Non Hodgkin's lymphoma   . Stroke   . Cancer    Past Surgical History  Procedure Laterality Date  . Occipital nerve stimulator insertion    . Abdominal hysterectomy    . Brain surgery     No family history on file. History  Substance Use Topics  . Smoking status: Never Smoker   . Smokeless tobacco: Not on file  . Alcohol Use: No   OB History   Grav Para Term Preterm Abortions TAB SAB Ect Mult Living                 Review of Systems  Constitutional: Negative for fever, chills, diaphoresis, appetite change and fatigue.  HENT: Negative for mouth sores, sore throat and trouble swallowing.   Eyes: Negative for visual disturbance.  Respiratory: Negative for cough, chest tightness, shortness of breath and wheezing.   Cardiovascular: Positive for chest pain.  Gastrointestinal: Negative for nausea, vomiting, abdominal pain, diarrhea and abdominal distention.   Endocrine: Negative for polydipsia, polyphagia and polyuria.  Genitourinary: Negative for dysuria, frequency and hematuria.  Musculoskeletal: Negative for gait problem.  Skin: Negative for color change, pallor and rash.  Neurological: Negative for dizziness, syncope, light-headedness and headaches.  Hematological: Does not bruise/bleed easily.  Psychiatric/Behavioral: Negative for behavioral problems and confusion.   Allergies  Amoxicillin; Ciprofloxacin; Codeine; Morphine and related; Sulfa antibiotics; Zomig; and Eggs or egg-derived products  Home Medications   Prior to Admission medications   Medication Sig Start Date End Date Taking? Authorizing Provider  atorvastatin (LIPITOR) 40 MG tablet Take 40 mg by mouth daily.   Yes Historical Provider, MD  Calcium Carbonate-Vitamin D (CALCIUM-VITAMIN D) 500-200 MG-UNIT per tablet Take 1 tablet by mouth 2 (two) times daily with a meal.   Yes Historical Provider, MD  cyclobenzaprine (FLEXERIL) 10 MG tablet Take 10 mg by mouth 3 (three) times daily as needed for muscle spasms.   Yes Historical Provider, MD  docusate sodium (COLACE) 100 MG capsule Take 100 mg by mouth 2 (two) times daily as needed.   Yes Historical Provider, MD  estrogens, conjugated, (PREMARIN) 0.3 MG tablet Take 0.3 mg by mouth daily. Take daily for 21 days then do not take for 7 days.   Yes Historical Provider, MD  glipiZIDE (GLUCOTROL) 5 MG tablet Take 5 mg by mouth daily before breakfast.  Yes Historical Provider, MD  hydrocortisone (CORTEF) 5 MG tablet Take 5 mg by mouth 2 (two) times daily.   Yes Historical Provider, MD  lacosamide (VIMPAT) 200 MG TABS tablet Take 200 mg by mouth 2 (two) times daily.   Yes Historical Provider, MD  lisinopril (PRINIVIL,ZESTRIL) 10 MG tablet Take 5 mg by mouth daily.   Yes Historical Provider, MD  loratadine (CLARITIN) 10 MG tablet Take 10 mg by mouth daily.   Yes Historical Provider, MD  magnesium oxide (MAG-OX) 400 MG tablet Take 400 mg by  mouth daily.   Yes Historical Provider, MD  Melatonin 5 MG TABS Take by mouth.   Yes Historical Provider, MD  omeprazole (PRILOSEC) 20 MG capsule Take 40 mg by mouth daily.   Yes Historical Provider, MD  prazosin (MINIPRESS) 1 MG capsule Take 1 mg by mouth at bedtime.   Yes Historical Provider, MD  prochlorperazine (COMPAZINE) 5 MG tablet Take 5 mg by mouth every 6 (six) hours as needed for nausea or vomiting.   Yes Historical Provider, MD  sertraline (ZOLOFT) 50 MG tablet Take 100 mg by mouth daily.    Yes Historical Provider, MD  topiramate (TOPAMAX) 25 MG capsule Take 100 mg by mouth daily.    Yes Historical Provider, MD  HYDROcodone-acetaminophen (NORCO/VICODIN) 5-325 MG per tablet Take 1 tablet by mouth every 4 (four) hours as needed. 07/05/14   Tanna Furry, MD  hydrOXYzine (ATARAX/VISTARIL) 25 MG tablet Take 25 mg by mouth 3 (three) times daily as needed.    Historical Provider, MD  nitrofurantoin (MACRODANTIN) 100 MG capsule Take 100 mg by mouth 4 (four) times daily.    Historical Provider, MD   Triage Vitals: BP 125/81  Pulse 96  Temp(Src) 97.7 F (36.5 C) (Oral)  Resp 18  Ht 5\' 8"  (1.727 m)  Wt 195 lb (88.451 kg)  BMI 29.66 kg/m2  SpO2 98%  Physical Exam  Constitutional: She is oriented to person, place, and time. She appears well-developed and well-nourished. No distress.  HENT:  Head: Normocephalic.  Eyes: Conjunctivae are normal. Pupils are equal, round, and reactive to light. No scleral icterus.  Neck: Normal range of motion. Neck supple. No thyromegaly present.  Cardiovascular: Normal rate and regular rhythm.  Exam reveals no gallop and no friction rub.   No murmur heard. Pulmonary/Chest: Effort normal and breath sounds normal. No respiratory distress. She has no wheezes. She has no rales.  Abdominal: Soft. Bowel sounds are normal. She exhibits no distension. There is no tenderness. There is no rebound.  Musculoskeletal: Normal range of motion.  Neurological: She is alert  and oriented to person, place, and time.  Skin: Skin is warm and dry. No rash noted.  Psychiatric: She has a normal mood and affect. Her behavior is normal.    ED Course  Procedures (including critical care time)  DIAGNOSTIC STUDIES: Oxygen Saturation is 98% on RA, normal by my interpretation.    COORDINATION OF CARE: 8:09 PM- Pt advised of plan for treatment and pt agrees.  Medications  HYDROmorphone (DILAUDID) injection 1 mg (1 mg Intravenous Given 07/05/14 2029)  ondansetron (ZOFRAN) injection 4 mg (4 mg Intravenous Given 07/05/14 2028)  ketorolac (TORADOL) 30 MG/ML injection 30 mg (30 mg Intravenous Given 07/05/14 2029)     Labs Review Labs Reviewed  CBC - Abnormal; Notable for the following:    Platelets 138 (*)    All other components within normal limits  COMPREHENSIVE METABOLIC PANEL - Abnormal; Notable for the following:  Potassium 3.6 (*)    Glucose, Bld 311 (*)    AST 44 (*)    ALT 50 (*)    GFR calc non Af Amer 83 (*)    All other components within normal limits  TROPONIN I    Imaging Review Dg Chest 2 View  07/05/2014   CLINICAL DATA:  Left side chest pain, history of non-Hodgkin lymphoma  EXAM: CHEST  2 VIEW  COMPARISON:  07/04/2009  FINDINGS: Cardiomediastinal silhouette is stable. There is streaky left infrahilar are atelectasis or early infiltrate. No pulmonary edema.  IMPRESSION: Streaky left infrahilar atelectasis or early infiltrate. No pulmonary edema.   Electronically Signed   By: Lahoma Crocker M.D.   On: 07/05/2014 20:52     EKG Interpretation   Date/Time:  Wednesday July 05 2014 19:23:36 EDT Ventricular Rate:  98 PR Interval:  138 QRS Duration: 74 QT Interval:  344 QTC Calculation: 439 R Axis:   77 Text Interpretation:  Normal sinus rhythm Low voltage QRS Borderline ECG  Confirmed by Jeneen Rinks  MD, [place] ([id number]) on 07/05/2014 8:07:16 PM      MDM   Final diagnoses:  Chest pain, unspecified chest pain type    X-ray shows small area of  probable atelectasis left midlung. No hypoxia. Not febrile. Normal white blood cell count. I do not think this represents pneumonia. Think is very likely atelectasis or the area that she had intended to see her bronchoscopy today and no pneumothorax. Pain resolved. I had wanted to do an additional troponin has dealt on her. She states she has to get home and she has children to 10 and 2, and requests discharge.  I personally performed the services described in this documentation, which was scribed in my presence. The recorded information has been reviewed and is accurate.    Tanna Furry, MD 07/05/14 2121

## 2019-07-20 ENCOUNTER — Emergency Department (HOSPITAL_BASED_OUTPATIENT_CLINIC_OR_DEPARTMENT_OTHER)
Admission: EM | Admit: 2019-07-20 | Discharge: 2019-07-20 | Disposition: A | Discharge status: Home / Self Care | Payer: Federal, State, Local not specified - PPO | Attending: Emergency Medicine | Admitting: Emergency Medicine

## 2019-07-20 ENCOUNTER — Other Ambulatory Visit: Payer: Self-pay

## 2019-07-20 ENCOUNTER — Encounter (HOSPITAL_BASED_OUTPATIENT_CLINIC_OR_DEPARTMENT_OTHER): Payer: Self-pay

## 2019-07-20 DIAGNOSIS — Z8673 Personal history of transient ischemic attack (TIA), and cerebral infarction without residual deficits: Secondary | ICD-10-CM | POA: Insufficient documentation

## 2019-07-20 DIAGNOSIS — R519 Headache, unspecified: Secondary | ICD-10-CM | POA: Diagnosis present

## 2019-07-20 DIAGNOSIS — Z79899 Other long term (current) drug therapy: Secondary | ICD-10-CM | POA: Insufficient documentation

## 2019-07-20 DIAGNOSIS — Z8572 Personal history of non-Hodgkin lymphomas: Secondary | ICD-10-CM | POA: Diagnosis not present

## 2019-07-20 DIAGNOSIS — G43809 Other migraine, not intractable, without status migrainosus: Secondary | ICD-10-CM | POA: Diagnosis not present

## 2019-07-20 HISTORY — DX: Malignant melanoma of skin, unspecified: C43.9

## 2019-07-20 HISTORY — DX: Unspecified convulsions: R56.9

## 2019-07-20 LAB — CBG MONITORING, ED: Glucose-Capillary: 349 mg/dL — ABNORMAL HIGH (ref 70–99)

## 2019-07-20 MED ORDER — HYDROMORPHONE HCL 1 MG/ML IJ SOLN
2.0000 mg | Freq: Once | INTRAMUSCULAR | Status: AC
  Administered 2019-07-20: 22:00:00 2 mg via INTRAMUSCULAR
  Filled 2019-07-20: qty 2

## 2019-07-20 MED ORDER — METOCLOPRAMIDE HCL 5 MG/ML IJ SOLN
10.0000 mg | Freq: Once | INTRAMUSCULAR | Status: AC
  Administered 2019-07-20: 10 mg via INTRAMUSCULAR
  Filled 2019-07-20: qty 2

## 2019-07-20 MED ORDER — HYDROCODONE-ACETAMINOPHEN 5-325 MG PO TABS: 1.0000 | ORAL_TABLET | Freq: Four times a day (QID) | ORAL | 0 refills | Status: AC | PRN

## 2019-07-20 NOTE — ED Provider Notes (Signed)
[facility] Provider Note   CSN: [id number] Arrival date & time: 07/20/19  2035     History   Chief Complaint Chief Complaint  Patient presents with  . Migraine    HPI [name] is a 58 y.o. female.     Patient is a 58 year old female with past medical history of migraine headaches.  She presents today for evaluation of headache.  This is been present for the past "6 months".  Patient describes a throbbing throughout her head with associated nausea and photophobia.  Patient sees a primary physician through the [place] who prescribes her normal medications.  She says in the past she has had hydrocodone which has helped, however this has not been prescribed for her for some time.  She denies any visual disturbances.  She denies any numbness or tingling.  The history is provided by the patient.  Migraine This is a recurrent problem. Episode onset: 6 months ago. The problem occurs constantly. The problem has been gradually worsening. Nothing aggravates the symptoms. Nothing relieves the symptoms. She has tried nothing for the symptoms.    Past Medical History:  Diagnosis Date  . Cancer ([place])   . Melanoma ([place])   . Migraine   . Non Hodgkin's lymphoma ([place])   . Seizures ([place])   . Stroke [facility])     There are no active problems to display for this patient.   Past Surgical History:  Procedure Laterality Date  . ABDOMINAL HYSTERECTOMY    . BRAIN SURGERY    . OCCIPITAL NERVE STIMULATOR INSERTION       OB History   No obstetric history on file.      Home Medications    Prior to Admission medications   Medication Sig Start Date End Date Taking? Authorizing Provider  atorvastatin (LIPITOR) 40 MG tablet Take 40 mg by mouth daily.    [provider]  Calcium Carbonate-Vitamin D (CALCIUM-VITAMIN D) 500-200 MG-UNIT per tablet Take 1 tablet by mouth 2 (two) times daily with a meal.    [provider]  cyclobenzaprine (FLEXERIL)  10 MG tablet Take 10 mg by mouth 3 (three) times daily as needed for muscle spasms.    [provider]  docusate sodium (COLACE) 100 MG capsule Take 100 mg by mouth 2 (two) times daily as needed.    [provider]  estrogens, conjugated, (PREMARIN) 0.3 MG tablet Take 0.3 mg by mouth daily. Take daily for 21 days then do not take for 7 days.    [provider]  glipiZIDE (GLUCOTROL) 5 MG tablet Take 5 mg by mouth daily before breakfast.    [provider]  HYDROcodone-acetaminophen (NORCO/VICODIN) 5-325 MG per tablet Take 1 tablet by mouth every 4 (four) hours as needed. 07/05/14   Tanna Furry, MD  hydrocortisone (CORTEF) 5 MG tablet Take 5 mg by mouth 2 (two) times daily.    [provider]  hydrOXYzine (ATARAX/VISTARIL) 25 MG tablet Take 25 mg by mouth 3 (three) times daily as needed.    [provider]  lacosamide (VIMPAT) 200 MG TABS tablet Take 200 mg by mouth 2 (two) times daily.    [provider]  lisinopril (PRINIVIL,ZESTRIL) 10 MG tablet Take 5 mg by mouth daily.    [provider]  loratadine (CLARITIN) 10 MG tablet Take 10 mg by mouth daily.    [provider]  magnesium oxide (MAG-OX) 400 MG tablet Take 400 mg by mouth daily.  [provider]  Melatonin 5 MG TABS Take by mouth.    [provider]  nitrofurantoin (MACRODANTIN) 100 MG capsule Take 100 mg by mouth 4 (four) times daily.    [provider]  omeprazole (PRILOSEC) 20 MG capsule Take 40 mg by mouth daily.    [provider]  prazosin (MINIPRESS) 1 MG capsule Take 1 mg by mouth at bedtime.    [provider]  prochlorperazine (COMPAZINE) 5 MG tablet Take 5 mg by mouth every 6 (six) hours as needed for nausea or vomiting.    [provider]  sertraline (ZOLOFT) 50 MG tablet Take 100 mg by mouth daily.     [provider]  topiramate (TOPAMAX) 25 MG capsule Take 100 mg by mouth daily.      [provider]    Family History No family history on file.  Social History Social History   Tobacco Use  . Smoking status: Never Smoker  . Smokeless tobacco: Never Used  Substance Use Topics  . Alcohol use: No  . Drug use: No     Allergies   Amoxicillin, Ciprofloxacin, Codeine, Morphine and related, Sulfa antibiotics, Zomig, and Eggs or egg-derived products   Review of Systems Review of Systems  All other systems reviewed and are negative.    Physical Exam Updated Vital Signs BP (!) 157/116 (BP Location: Left Arm)   Pulse (!) 109   Temp 98.1 F (36.7 C) (Oral)   Resp 18   Ht 5\' 8"  (1.727 m)   Wt 79.8 kg   SpO2 97%   BMI 26.75 kg/m   Physical Exam Vitals signs and nursing note reviewed.  Constitutional:      General: She is not in acute distress.    Appearance: She is well-developed. She is not diaphoretic.  HENT:     Head: Normocephalic and atraumatic.  Eyes:     Extraocular Movements: Extraocular movements intact.     Pupils: Pupils are equal, round, and reactive to light.  Neck:     Musculoskeletal: Normal range of motion and neck supple.  Cardiovascular:     Rate and Rhythm: Normal rate and regular rhythm.     Heart sounds: No murmur. No friction rub. No gallop.   Pulmonary:     Effort: Pulmonary effort is normal. No respiratory distress.     Breath sounds: Normal breath sounds. No wheezing.  Abdominal:     General: Bowel sounds are normal. There is no distension.     Palpations: Abdomen is soft.     Tenderness: There is no abdominal tenderness.  Musculoskeletal: Normal range of motion.  Skin:    General: Skin is warm and dry.  Neurological:     General: No focal deficit present.     Mental Status: She is alert and oriented to person, place, and time.     Cranial Nerves: No cranial nerve deficit.     Sensory: No sensory deficit.     Motor: No weakness.      ED Treatments / Results  Labs (all labs ordered are listed, but only  abnormal results are displayed) Labs Reviewed  CBG MONITORING, ED - Abnormal; Notable for the following components:      Result Value   Glucose-Capillary 349 (*)    All other components within normal limits  CBG MONITORING, ED    EKG None  Radiology No results found.  Procedures Procedures (including critical care time)  Medications Ordered in ED Medications  HYDROmorphone (  DILAUDID) injection 2 mg (has no administration in time range)  metoCLOPramide (REGLAN) injection 10 mg (has no administration in time range)     Initial Impression / Assessment and Plan / ED Course  I have reviewed the triage vital signs and the nursing notes.  Pertinent labs & imaging results that were available during my care of the patient were reviewed by me and considered in my medical decision making (see chart for details).  Patient presents here with complaints of headache.  She has a history of migraines and this feels similar.  She was given Dilaudid and Reglan as she says this combination usually helps.  I have reviewed the narcotic database and patient has not filled narcotics in many months.  I am comfortable with administering this as well as a small quantity of hydrocodone she can take at home.  She is to follow-up with her primary doctor/neurologist for additional issues.  Final Clinical Impressions(s) / ED Diagnoses   Final diagnoses:  None    ED Discharge Orders    None       Veryl Speak, MD 07/20/19 2225

## 2019-07-20 NOTE — Discharge Instructions (Signed)
Continue home meds as previously prescribed.  Hydrocodone as prescribed as needed for recurrent headache.  Follow-up with your primary doctor/neurologist in the near future if symptoms are not improving.

## 2019-07-20 NOTE — ED Triage Notes (Signed)
Pt c/o migraine x 5 weeks-also reports elevated BS- "470" today-states she has had "phone visits with the [place] for migraine-NAD-steady gait

## 2019-07-20 NOTE — ED Notes (Signed)
Pt's husband is driving her home.

## 2021-01-03 ENCOUNTER — Ambulatory Visit: Payer: No Typology Code available for payment source | Admitting: Allergy
